# Patient Record
Sex: Female | Born: 1980 | Race: Black or African American | Hispanic: No | Smoking: Never smoker
Health system: Southern US, Community
[De-identification: ages and names within clinical notes are randomized; demographics above are authoritative.]

## PROBLEM LIST (undated history)

## (undated) DIAGNOSIS — D573 Sickle-cell trait: Secondary | ICD-10-CM

## (undated) DIAGNOSIS — R87629 Unspecified abnormal cytological findings in specimens from vagina: Secondary | ICD-10-CM

## (undated) DIAGNOSIS — Z789 Other specified health status: Secondary | ICD-10-CM

## (undated) HISTORY — PX: OTHER SURGICAL HISTORY: SHX169

## (undated) HISTORY — DX: Unspecified abnormal cytological findings in specimens from vagina: R87.629

## (undated) HISTORY — DX: Sickle-cell trait: D57.3

---

## 2002-06-04 DIAGNOSIS — R87629 Unspecified abnormal cytological findings in specimens from vagina: Secondary | ICD-10-CM

## 2002-06-04 HISTORY — DX: Unspecified abnormal cytological findings in specimens from vagina: R87.629

## 2013-01-15 LAB — OB RESULTS CONSOLE HGB/HCT, BLOOD
HCT: 32 %
HEMOGLOBIN: 9.1 g/dL

## 2013-01-15 LAB — OB RESULTS CONSOLE ABO/RH: RH Type: POSITIVE

## 2013-01-15 LAB — OB RESULTS CONSOLE HEPATITIS B SURFACE ANTIGEN: Hepatitis B Surface Ag: NEGATIVE

## 2013-01-15 LAB — OB RESULTS CONSOLE RUBELLA ANTIBODY, IGM: RUBELLA: IMMUNE

## 2013-01-15 LAB — OB RESULTS CONSOLE HIV ANTIBODY (ROUTINE TESTING): HIV: NONREACTIVE

## 2013-01-15 LAB — OB RESULTS CONSOLE PLATELET COUNT: Platelets: 282 10*3/uL

## 2013-01-15 LAB — OB RESULTS CONSOLE GC/CHLAMYDIA
Chlamydia: NEGATIVE
GC PROBE AMP, GENITAL: NEGATIVE

## 2013-01-15 LAB — OB RESULTS CONSOLE ANTIBODY SCREEN: Antibody Screen: NEGATIVE

## 2013-05-21 LAB — OB RESULTS CONSOLE PLATELET COUNT: Platelets: 288 10*3/uL

## 2013-05-21 LAB — OB RESULTS CONSOLE HGB/HCT, BLOOD
HCT: 29 %
HEMOGLOBIN: 9.1 g/dL

## 2013-05-21 LAB — GLUCOSE TOLERANCE, 1 HOUR: Glucose, 1 Hour GTT: 108

## 2013-06-24 ENCOUNTER — Encounter: Payer: Self-pay | Admitting: *Deleted

## 2013-07-02 DIAGNOSIS — B009 Herpesviral infection, unspecified: Secondary | ICD-10-CM | POA: Insufficient documentation

## 2013-07-02 DIAGNOSIS — O34219 Maternal care for unspecified type scar from previous cesarean delivery: Secondary | ICD-10-CM | POA: Insufficient documentation

## 2013-07-06 ENCOUNTER — Encounter: Payer: Self-pay | Admitting: Family Medicine

## 2013-07-07 ENCOUNTER — Telehealth: Payer: Self-pay | Admitting: *Deleted

## 2013-07-07 NOTE — Telephone Encounter (Signed)
[place] left a message requesting a call back. States she is 8 months pregnant and will be first appointment with [facility]. States she hasn't seen a physician since January and needs to be seen by a physician.  Edgardo RoysCalled Reily and she states since she left the message she has talked to someone and has an appointment 07/09/13 and doesn't have any other concerns.

## 2013-07-09 ENCOUNTER — Encounter: Payer: Self-pay | Admitting: Obstetrics & Gynecology

## 2013-07-09 ENCOUNTER — Encounter: Payer: Self-pay | Admitting: Obstetrics and Gynecology

## 2013-07-09 ENCOUNTER — Ambulatory Visit (INDEPENDENT_AMBULATORY_CARE_PROVIDER_SITE_OTHER): Payer: Self-pay | Admitting: Obstetrics & Gynecology

## 2013-07-09 VITALS — BP 110/68 | Temp 97.8°F | Ht 67.0 in | Wt 229.5 lb

## 2013-07-09 DIAGNOSIS — O34219 Maternal care for unspecified type scar from previous cesarean delivery: Secondary | ICD-10-CM

## 2013-07-09 DIAGNOSIS — B009 Herpesviral infection, unspecified: Secondary | ICD-10-CM

## 2013-07-09 LAB — POCT URINALYSIS DIP (DEVICE)
BILIRUBIN URINE: NEGATIVE
GLUCOSE, UA: NEGATIVE mg/dL
HGB URINE DIPSTICK: NEGATIVE
Ketones, ur: NEGATIVE mg/dL
Nitrite: NEGATIVE
Protein, ur: NEGATIVE mg/dL
SPECIFIC GRAVITY, URINE: 1.02 (ref 1.005–1.030)
Urobilinogen, UA: 1 mg/dL (ref 0.0–1.0)
pH: 6 (ref 5.0–8.0)

## 2013-07-09 NOTE — Progress Notes (Signed)
   Subjective:    Brenda Kelley is a [id number] 5155w6d being seen today for her first obstetrical visit.  Her obstetrical history is significant for obesity. Patient does intend to breast feed. Pregnancy history fully reviewed.  Patient reports no complaints.  Filed Vitals:   07/09/13 0848 07/09/13 0851  BP: 110/68   Temp: 97.8 F (36.6 C)   Height:  5\' 7"  (1.702 m)  Weight: 229 lb 8 oz (104.101 kg)     HISTORY: OB History  Gravida Para Term Preterm AB SAB TAB Ectopic Multiple Living  7 2 2  4  4   2     # Outcome Date GA Lbr Len/2nd Weight Sex Delivery Anes PTL Lv  7 CUR           6 TRM 02/13/08 233w0d   M LTCS Spinal N Y  5 TRM 09/13/04 533w0d  8 lb 1 oz (3.657 kg) F LTCS   Y     Comments: non reassuring fetal status  4 TAB           3 TAB           2 TAB           1 TAB              Past Medical History  Diagnosis Date  . Vaginal Pap smear, abnormal 2004   Past Surgical History  Procedure Laterality Date  . Cesarean section      x 2  . Keloid removal  2010   Family History  Problem Relation Age of Onset  . Heart disease Mother   . Hypertension Mother      Exam    Uterus:     Pelvic Exam:    Perineum: No Hemorrhoids   Vulva: normal   Vagina:  normal mucosa   pH:    Cervix: anteverted   Adnexa: normal adnexa   Bony Pelvis: android  System: Breast:  normal appearance, no masses or tenderness   Skin: normal coloration and turgor, no rashes    Neurologic: oriented   Extremities: normal strength, tone, and muscle mass   HEENT PERRLA   Mouth/Teeth mucous membranes moist, pharynx normal without lesions   Neck supple   Cardiovascular: regular rate and rhythm   Respiratory:  appears well, vitals normal, no respiratory distress, acyanotic, normal RR, ear and throat exam is normal, neck free of mass or lymphadenopathy, chest clear, no wheezing, crepitations, rhonchi, normal symmetric air entry   Abdomen: soft, non-tender; bowel sounds normal; no masses,  no  organomegaly   Urinary: urethral meatus normal      Assessment:    Pregnancy: [name] Patient Active Problem List   Diagnosis Date Noted  . 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 Previous cesarean delivery, delivered, with or without mention of antepartum condition 07/02/2013        Plan:     Initial labs drawn. Prenatal vitamins. Problem list reviewed and updated. Genetic Screening discussed Quad Screen: results reviewed.  Ultrasound discussed; fetal survey: results reviewed.  Follow up in 2 weeks. [facility] [place] Family is here. She wants a PPS with her RLTCS and is in the process of obtained medicaid. I will go ahead and send [place] an email to schedule her RLTCS and PPS for [redacted] weeks EGA.  Brenda Kelley C. 07/09/2013

## 2013-07-09 NOTE — Progress Notes (Deleted)
Nutrition note: 1st visit consult Pt has h/o obesity.

## 2013-07-09 NOTE — Progress Notes (Incomplete Revision)
Nutrition note: 1st visit consult Pt has h/o obesity. Pt has lost 10.5# @ 7822w6d. Pt stated her lowest was 224# about 2 months ago so she has gained 5.5# since then. Pt reports she had a lot of N/V in the beginning but is better now. Pt reports no heartburn. Pt reports eating 3 meals & 3-4 snacks/d. Pt is taking gummy PNV, which most do not include adequate minerals for pregnancy. Pt reports walking. Pt received verbal & written education on general nutrition during pregnancy. Encouraged pt to consume PNV or 2 chewable multivitamins. Encouraged energy dense snacks. Discussed wt gain goals of 11-20# or 0.5#/wk. Pt agrees to take adequate PNV. Pt has WIC & plans to BF/ pump. F/u in 2-4 wks Brenda RevealLaura Clif Serio, MS, RD, LDN, [facility]

## 2013-07-09 NOTE — Progress Notes (Signed)
Pulse- 93 Weight gain 25-35lb New ob packet given Flu vaccine given in [place]

## 2013-07-10 LAB — PRESCRIPTION MONITORING PROFILE (19 PANEL)
AMPHETAMINE/METH: NEGATIVE ng/mL
Barbiturate Screen, Urine: NEGATIVE ng/mL
Benzodiazepine Screen, Urine: NEGATIVE ng/mL
Buprenorphine, Urine: NEGATIVE ng/mL
CARISOPRODOL, URINE: NEGATIVE ng/mL
CREATININE, URINE: 178.1 mg/dL (ref >20.0)
Cannabinoid Scrn, Ur: NEGATIVE ng/mL
Cocaine Metabolites: NEGATIVE ng/mL
ECSTASY: NEGATIVE ng/mL
Fentanyl, Ur: NEGATIVE ng/mL
MEPERIDINE UR: NEGATIVE ng/mL
METHADONE SCREEN, URINE: NEGATIVE ng/mL
Methaqualone: NEGATIVE ng/mL
NITRITES URINE, INITIAL: NEGATIVE ug/mL
OXYCODONE SCRN UR: NEGATIVE ng/mL
Opiate Screen, Urine: NEGATIVE ng/mL
PH URINE, INITIAL: 6.3 pH (ref 4.5–8.9)
PROPOXYPHENE: NEGATIVE ng/mL
Phencyclidine, Ur: NEGATIVE ng/mL
TAPENTADOLUR: NEGATIVE ng/mL
TRAMADOL UR: NEGATIVE ng/mL
Zolpidem, Urine: NEGATIVE ng/mL

## 2013-07-12 LAB — CULTURE, OB URINE

## 2013-07-14 ENCOUNTER — Telehealth: Payer: Self-pay | Admitting: *Deleted

## 2013-07-14 DIAGNOSIS — N390 Urinary tract infection, site not specified: Secondary | ICD-10-CM

## 2013-07-14 NOTE — Telephone Encounter (Signed)
Message to patient to come and sign ROI before appt.  We need the hospital's name for the last cesarean section.  When received, scan and send to Dr. Penne LashLeggett.

## 2013-07-14 NOTE — Telephone Encounter (Signed)
Message copied by Dorothyann PengHAIZLIP, Mackena Plummer E on Tue Jul 14, 2013  9:07 AM ------      Message from: Lesly DukesLEGGETT, KELLY H      Created: Tue Jul 14, 2013  5:08 AM       Please request last c/s op note.  Scan and send to my attention, please.  Thx ------

## 2013-07-15 MED ORDER — SULFAMETHOXAZOLE-TMP DS 800-160 MG PO TABS: 1.0000 | ORAL_TABLET | Freq: Two times a day (BID) | ORAL | Status: DC

## 2013-07-15 NOTE — Telephone Encounter (Signed)
Called pt. And informed her of UTI and [facility].Also informed her we need her to come in to sign ROI so that we can request her C/S records from her last delivery. Pt. States she will come in at sometime before the end of the week to do this. Records need to be received, scanned and sent to Dr. Penne LashLeggett.

## 2013-07-15 NOTE — Telephone Encounter (Signed)
Message copied by Louanna RawAMPBELL, Brienna Bass M on Wed Jul 15, 2013  8:41 AM ------      Message from: Allie BossierVE, MYRA C      Created: Tue Jul 14, 2013  1:37 PM       She has a UTI. Can you please prescribe bactrim DS po BID for 5 days?       Thanks ------

## 2013-07-22 ENCOUNTER — Ambulatory Visit (INDEPENDENT_AMBULATORY_CARE_PROVIDER_SITE_OTHER): Payer: Medicaid - Out of State | Admitting: Family

## 2013-07-22 ENCOUNTER — Encounter: Payer: Self-pay | Admitting: Family

## 2013-07-22 VITALS — BP 130/78 | Temp 96.5°F | Wt 230.0 lb

## 2013-07-22 DIAGNOSIS — O34219 Maternal care for unspecified type scar from previous cesarean delivery: Secondary | ICD-10-CM

## 2013-07-22 DIAGNOSIS — Z3480 Encounter for supervision of other normal pregnancy, unspecified trimester: Secondary | ICD-10-CM

## 2013-07-22 DIAGNOSIS — Z3490 Encounter for supervision of normal pregnancy, unspecified, unspecified trimester: Secondary | ICD-10-CM | POA: Insufficient documentation

## 2013-07-22 DIAGNOSIS — B009 Herpesviral infection, unspecified: Secondary | ICD-10-CM

## 2013-07-22 LAB — POCT URINALYSIS DIP (DEVICE)
BILIRUBIN URINE: NEGATIVE
GLUCOSE, UA: NEGATIVE mg/dL
HGB URINE DIPSTICK: NEGATIVE
Ketones, ur: NEGATIVE mg/dL
Leukocytes, UA: NEGATIVE
NITRITE: NEGATIVE
Protein, ur: NEGATIVE mg/dL
SPECIFIC GRAVITY, URINE: 1.02 (ref 1.005–1.030)
Urobilinogen, UA: 1 mg/dL (ref 0.0–1.0)
pH: 6.5 (ref 5.0–8.0)

## 2013-07-22 NOTE — Progress Notes (Signed)
No questions or concerns.  Pt transfer from [place], reviewed prenatal records all labs nml.  Desires BTL; [place] > need consent signed once Medicaid approved here.  Begin acyclovir at 36 wks.

## 2013-07-22 NOTE — Progress Notes (Signed)
Pulse- 91 Patient reports contractions every couple days or so

## 2013-07-23 ENCOUNTER — Ambulatory Visit (HOSPITAL_COMMUNITY)
Admission: RE | Admit: 2013-07-23 | Discharge: 2013-07-23 | Disposition: A | Discharge status: Home / Self Care | Payer: Medicaid Other | Source: Ambulatory Visit | Attending: Family | Admitting: Family

## 2013-07-23 ENCOUNTER — Other Ambulatory Visit: Payer: Self-pay | Admitting: Family

## 2013-07-23 DIAGNOSIS — O34219 Maternal care for unspecified type scar from previous cesarean delivery: Secondary | ICD-10-CM

## 2013-07-23 DIAGNOSIS — Z3689 Encounter for other specified antenatal screening: Secondary | ICD-10-CM | POA: Diagnosis not present

## 2013-07-27 ENCOUNTER — Encounter: Payer: Self-pay | Admitting: Obstetrics & Gynecology

## 2013-08-04 ENCOUNTER — Ambulatory Visit (INDEPENDENT_AMBULATORY_CARE_PROVIDER_SITE_OTHER): Payer: Medicaid Other | Admitting: Obstetrics & Gynecology

## 2013-08-04 VITALS — BP 109/75 | Temp 97.0°F | Wt 230.4 lb

## 2013-08-04 DIAGNOSIS — Z3490 Encounter for supervision of normal pregnancy, unspecified, unspecified trimester: Secondary | ICD-10-CM

## 2013-08-04 DIAGNOSIS — O34219 Maternal care for unspecified type scar from previous cesarean delivery: Secondary | ICD-10-CM

## 2013-08-04 DIAGNOSIS — B009 Herpesviral infection, unspecified: Secondary | ICD-10-CM

## 2013-08-04 LAB — POCT URINALYSIS DIP (DEVICE)
BILIRUBIN URINE: NEGATIVE
Glucose, UA: NEGATIVE mg/dL
KETONES UR: NEGATIVE mg/dL
Nitrite: NEGATIVE
PH: 6 (ref 5.0–8.0)
Protein, ur: 30 mg/dL — AB
Specific Gravity, Urine: 1.02 (ref 1.005–1.030)
Urobilinogen, UA: 1 mg/dL (ref 0.0–1.0)

## 2013-08-04 MED ORDER — VALACYCLOVIR HCL 500 MG PO TABS: 500.0000 mg | ORAL_TABLET | Freq: Two times a day (BID) | ORAL | Status: DC

## 2013-08-04 NOTE — Progress Notes (Signed)
p 

## 2013-08-04 NOTE — Patient Instructions (Signed)
Cesarean Delivery °Care After °Refer to this sheet in the next few weeks. These instructions provide you with information on caring for yourself after your procedure. Your health care provider may also give you specific instructions. Your treatment has been planned according to current medical practices, but problems sometimes occur. Call your health care provider if you have any problems or questions after you go home. °HOME CARE INSTRUCTIONS  °· Only take over-the-counter or prescription medications as directed by your health care provider. °· Do not drink alcohol, especially if you are breastfeeding or taking medication to relieve pain. °· Do not chew or smoke tobacco. °· Continue to use good perineal care. Good perineal care includes: °· Wiping your perineum from front to back. °· Keeping your perineum clean. °· Check your surgical cut (incision) daily for increased redness, drainage, swelling, or separation of skin. °· Clean your incision gently with soap and water every day, and then pat it dry. If your health care provider says it is OK, leave the incision uncovered. Use a bandage (dressing) if the incision is draining fluid or appears irritated. If the adhesive strips across the incision do not fall off within 7 days, carefully peel them off. °· Hug a pillow when coughing or sneezing until your incision is healed. This helps to relieve pain. °· Do not use tampons or douche until your health care provider says it is okay. °· Shower, wash your hair, and take tub baths as directed by your health care provider. °· Wear a well-fitting bra that provides breast support. °· Limit wearing support panties or control-top hose. °· Drink enough fluids to keep your urine clear or pale yellow. °· Eat high-fiber foods such as whole grain cereals and breads, brown rice, beans, and fresh fruits and vegetables every day. These foods may help prevent or relieve constipation. °· Resume activities such as climbing stairs,  driving, lifting, exercising, or traveling as directed by your health care provider. °· Talk to your health care provider about resuming sexual activities. This is dependent upon your risk of infection, your rate of healing, and your comfort and desire to resume sexual activity. °· Try to have someone help you with your household activities and your newborn for at least a few days after you leave the hospital. °· Rest as much as possible. Try to rest or take a nap when your newborn is sleeping. °· Increase your activities gradually. °· Keep all of your scheduled postpartum appointments. It is very important to keep your scheduled follow-up appointments. At these appointments, your health care provider will be checking to make sure that you are healing physically and emotionally. °SEEK MEDICAL CARE IF:  °· You are passing large clots from your vagina. Save any clots to show your health care provider. °· You have a foul smelling discharge from your vagina. °· You have trouble urinating. °· You are urinating frequently. °· You have pain when you urinate. °· You have a change in your bowel movements. °· You have increasing redness, pain, or swelling near your incision. °· You have pus draining from your incision. °· Your incision is separating. °· You have painful, hard, or reddened breasts. °· You have a severe headache. °· You have blurred vision or see spots. °· You feel sad or depressed. °· You have thoughts of hurting yourself or your newborn. °· You have questions about your care, the care of your newborn, or medications. °· You are dizzy or lightheaded. °· You have a rash. °· You   have pain, redness, or swelling at the site of the removed intravenous access (IV) tube. °· You have nausea or vomiting. °· You stopped breastfeeding and have not had a menstrual period within 12 weeks of stopping. °· You are not breastfeeding and have not had a menstrual period within 12 weeks of delivery. °· You have a fever. °SEEK  IMMEDIATE MEDICAL CARE IF: °· You have persistent pain. °· You have chest pain. °· You have shortness of breath. °· You faint. °· You have leg pain. °· You have stomach pain. °· Your vaginal bleeding saturates 2 or more sanitary pads in 1 hour. °MAKE SURE YOU:  °· Understand these instructions. °· Will watch your condition. °· Will get help right away if you are not doing well or get worse. °Document Released: 02/10/2002 Document Revised: 01/21/2013 Document Reviewed: 01/16/2012 °ExitCare® Patient Information ©2014 Exit[employer], LLC. ° ° ° °

## 2013-08-04 NOTE — Progress Notes (Signed)
CS scheduled3/20/15.

## 2013-08-04 NOTE — Progress Notes (Signed)
Start Valtrex for suppression. GBS and GC CT today. US 2/19 reviewed. CS 3/20

## 2013-08-05 LAB — GC/CHLAMYDIA PROBE AMP
CT PROBE, AMP APTIMA: POSITIVE — AB
GC Probe RNA: NEGATIVE

## 2013-08-05 LAB — OB RESULTS CONSOLE GC/CHLAMYDIA
Chlamydia: POSITIVE
Gonorrhea: NEGATIVE

## 2013-08-05 LAB — OB RESULTS CONSOLE GBS: GBS: NEGATIVE

## 2013-08-06 ENCOUNTER — Telehealth: Payer: Self-pay

## 2013-08-06 DIAGNOSIS — A749 Chlamydial infection, unspecified: Secondary | ICD-10-CM

## 2013-08-06 LAB — CULTURE, BETA STREP (GROUP B ONLY)

## 2013-08-06 MED ORDER — AZITHROMYCIN 500 MG PO TABS: 1000.0000 mg | ORAL_TABLET | Freq: Once | ORAL | Status: DC

## 2013-08-06 NOTE — Telephone Encounter (Signed)
Message copied by Louanna RawAMPBELL, Maudene Stotler M on Thu Aug 06, 2013  8:26 AM ------      Message from: Adam PhenixARNOLD, JAMES G      Created: Wed Aug 05, 2013  1:50 PM       Needs azithromycin 1 g PO for chlamydia ------

## 2013-08-06 NOTE — Telephone Encounter (Signed)
Called pt. And informed her of positive chlamydia culture and the need to take Zithromax to treat it- [facility]. Informed her to ensure her partner gets treated and they abstain from sex until both are treated. Pt. Verbalized understanding and had no questions or concerns.

## 2013-08-06 NOTE — Progress Notes (Signed)
ROI (to request C/s notes from 2008/9) faxed to [facility] [phone] on 08/03/13 by Marylynn Pearsonarrie Hillman, RN. No reports faxed back. Called today and spoke to Medical Records to follow up. Staff member stated he did not see ROI-- re-faxed ROI to [phone] with ATTN: Medical Records and please send C/S notes ASAP per request of Medical Records Staff member. Called an hour later to ensure it was received-- staff member stated it was received and they are working on it now and that we should have it by end of day.

## 2013-08-08 ENCOUNTER — Encounter: Payer: Self-pay | Admitting: *Deleted

## 2013-08-10 ENCOUNTER — Encounter: Payer: Self-pay | Admitting: *Deleted

## 2013-08-11 ENCOUNTER — Ambulatory Visit (INDEPENDENT_AMBULATORY_CARE_PROVIDER_SITE_OTHER): Payer: Medicaid Other | Admitting: Obstetrics & Gynecology

## 2013-08-11 ENCOUNTER — Encounter: Payer: Self-pay | Admitting: Obstetrics & Gynecology

## 2013-08-11 VITALS — BP 111/71 | Temp 98.1°F | Wt 232.6 lb

## 2013-08-11 DIAGNOSIS — O3483 Maternal care for other abnormalities of pelvic organs, third trimester: Secondary | ICD-10-CM | POA: Diagnosis present

## 2013-08-11 DIAGNOSIS — D649 Anemia, unspecified: Secondary | ICD-10-CM

## 2013-08-11 DIAGNOSIS — N736 Female pelvic peritoneal adhesions (postinfective): Secondary | ICD-10-CM | POA: Diagnosis present

## 2013-08-11 DIAGNOSIS — B009 Herpesviral infection, unspecified: Secondary | ICD-10-CM

## 2013-08-11 DIAGNOSIS — Z113 Encounter for screening for infections with a predominantly sexual mode of transmission: Secondary | ICD-10-CM

## 2013-08-11 DIAGNOSIS — Z3480 Encounter for supervision of other normal pregnancy, unspecified trimester: Secondary | ICD-10-CM

## 2013-08-11 DIAGNOSIS — Z23 Encounter for immunization: Secondary | ICD-10-CM

## 2013-08-11 DIAGNOSIS — Z3490 Encounter for supervision of normal pregnancy, unspecified, unspecified trimester: Secondary | ICD-10-CM

## 2013-08-11 LAB — CBC
HCT: 30.7 % — ABNORMAL LOW (ref 36.0–46.0)
Hemoglobin: 10 g/dL — ABNORMAL LOW (ref 12.0–15.0)
MCH: 20.8 pg — ABNORMAL LOW (ref 26.0–34.0)
MCHC: 32.6 g/dL (ref 30.0–36.0)
MCV: 63.8 fL — ABNORMAL LOW (ref 78.0–100.0)
Platelets: 249 10*3/uL (ref 150–400)
RBC: 4.81 MIL/uL (ref 3.87–5.11)
RDW: 19.2 % — ABNORMAL HIGH (ref 11.5–15.5)
WBC: 4.9 10*3/uL (ref 4.0–10.5)

## 2013-08-11 LAB — POCT URINALYSIS DIP (DEVICE)
Bilirubin Urine: NEGATIVE
Glucose, UA: NEGATIVE mg/dL
Hgb urine dipstick: NEGATIVE
Ketones, ur: NEGATIVE mg/dL
Nitrite: NEGATIVE
Protein, ur: NEGATIVE mg/dL
Specific Gravity, Urine: 1.02 (ref 1.005–1.030)
Urobilinogen, UA: 1 mg/dL (ref 0.0–1.0)
pH: 6.5 (ref 5.0–8.0)

## 2013-08-11 LAB — RPR

## 2013-08-11 LAB — HIV ANTIBODY (ROUTINE TESTING W REFLEX): HIV: NONREACTIVE

## 2013-08-11 MED ORDER — TETANUS-DIPHTH-ACELL PERTUSSIS 5-2.5-18.5 LF-MCG/0.5 IM SUSP: 0.5000 mL | Freq: Once | INTRAMUSCULAR | Status: DC

## 2013-08-11 NOTE — Progress Notes (Signed)
P= 91 C/o of braxton hicks Pt. Requests to have HIV testing and iron labs drawn because she was told she was anemic before she transferred and would like to know if her levels are where she needs to be.

## 2013-08-11 NOTE — Patient Instructions (Signed)
HIV Antibody Test HIV is a virus which destroys our body's ability to fight illness. It does this by causing defects in our immune system. This is the system that protects our body against infections. This virus is the cause of an illness called AIDS. HIV antibodies are made by the infected person's body when a person becomes infected with HIV. WHAT IS THE HIV ANTIBODY TEST? This is a test for HIV antibodies that are found in the blood of an infected person. This test is not a test for AIDS. It only means that you have been infected with HIV and may eventually develop AIDS. WHO IS AT RISK OF BEING INFECTED WITH HIV?  People who have unsafe sex (unsafe sex means having sex without a condom (or other protective barrier) with a person who has the virus.  People who share IV needles or syringes with a person who has the virus.  Anyone who got blood, blood products, or organ transplants before 1985.  Babies born to mothers who have HIV.  Coming in contact with blood or other body fluids of someone infected with HIV. WHAT DOES A NEGATIVE HIV TEST RESULT MEAN? HIV antibodies were not found in your blood. Most of the time it takes our bodies between 6 weeks and 6 months to develop antibodies to HIV. It may take up to one year to develop. During this time, infected people can have a negative result even if they have the virus and will therefore not know if they are putting other people at risk. They should take all necessary precautions to protect others from becoming infected. WHAT DOES A POSITIVE HIV TEST RESULT MEAN?  A positive HIV test means a person has been infected with the HIV virus. This does NOT mean that a person has AIDS, but they may eventually develop it.  A person can give HIV infection to other people through unsafe sex. Sharing IV needles or syringes can also spread HIV.  A woman who has HIV can give the virus to her baby during pregnancy or at birth or possibly from breastfeeding.  Get counseling prior to considering pregnancy. One third to one half of women with HIV infection will pass this infection on to their baby.  Anyone with a positive test for HIV should not donate blood, plasma, blood products, organs or tissues. WHERE CAN I GO TO BE TESTED?  Most county health departments offer HIV Antibody counseling and testing.  Many doctors and other caregivers offer HIV Antibody counseling and testing. If you received a blood test from your caregiver, call for your results as instructed. Remember it is your responsibility to get the results of your test. Do not assume everything is fine if you do not hear from your caregiver. If you get a positive test result, talk to your caregiver to find out what steps to take to assure you receive the best of care. Numerous medications are now available which improve the course of this infection. Document Released: 05/18/2000 Document Revised: 08/13/2011 Document Reviewed: 05/21/2005 ExitCare Patient Information 2014 ExitCare, LLC.     

## 2013-08-11 NOTE — Progress Notes (Signed)
Pt c/o occ ctx Wants HIV test done as spouse 'gave me chlamydia' No LOF

## 2013-08-13 ENCOUNTER — Encounter (HOSPITAL_COMMUNITY): Payer: Self-pay | Admitting: Pharmacist

## 2013-08-18 ENCOUNTER — Ambulatory Visit (INDEPENDENT_AMBULATORY_CARE_PROVIDER_SITE_OTHER): Payer: Medicaid Other | Admitting: Medical

## 2013-08-18 VITALS — BP 120/78 | Temp 98.5°F | Wt 232.3 lb

## 2013-08-18 DIAGNOSIS — Z3480 Encounter for supervision of other normal pregnancy, unspecified trimester: Secondary | ICD-10-CM

## 2013-08-18 DIAGNOSIS — Z3490 Encounter for supervision of normal pregnancy, unspecified, unspecified trimester: Secondary | ICD-10-CM

## 2013-08-18 LAB — POCT URINALYSIS DIP (DEVICE)
BILIRUBIN URINE: NEGATIVE
Glucose, UA: NEGATIVE mg/dL
Ketones, ur: NEGATIVE mg/dL
Nitrite: NEGATIVE
Protein, ur: NEGATIVE mg/dL
Specific Gravity, Urine: 1.015 (ref 1.005–1.030)
Urobilinogen, UA: 1 mg/dL (ref 0.0–1.0)
pH: 6.5 (ref 5.0–8.0)

## 2013-08-18 NOTE — Progress Notes (Signed)
Patient doing well. +FM. Braxton Hicks Ctx. No LOF.

## 2013-08-18 NOTE — Progress Notes (Signed)
p=106 

## 2013-08-18 NOTE — Patient Instructions (Signed)
Levonorgestrel intrauterine device (IUD) What is this medicine? LEVONORGESTREL IUD (LEE voe nor jes trel) is a contraceptive (birth control) device. The device is placed inside the uterus by a healthcare professional. It is used to prevent pregnancy and can also be used to treat heavy bleeding that occurs during your period. Depending on the device, it can be used for 3 to 5 years. This medicine may be used for other purposes; ask your health care provider or pharmacist if you have questions. COMMON BRAND NAME(S): Mirena, Skyla What should I tell my health care provider before I take this medicine? They need to know if you have any of these conditions: -abnormal Pap smear -cancer of the breast, uterus, or cervix -diabetes -endometritis -genital or pelvic infection now or in the past -have more than one sexual partner or your partner has more than one partner -heart disease -history of an ectopic or tubal pregnancy -immune system problems -IUD in place -liver disease or tumor -problems with blood clots or take blood-thinners -use intravenous drugs -uterus of unusual shape -vaginal bleeding that has not been explained -an unusual or allergic reaction to levonorgestrel, other hormones, silicone, or polyethylene, medicines, foods, dyes, or preservatives -pregnant or trying to get pregnant -breast-feeding How should I use this medicine? This device is placed inside the uterus by a health care professional. Talk to your pediatrician regarding the use of this medicine in children. Special care may be needed. Overdosage: If you think you have taken too much of this medicine contact a poison control center or emergency room at once. NOTE: This medicine is only for you. Do not share this medicine with others. What if I miss a dose? This does not apply. What may interact with this medicine? Do not take this medicine with any of the following  medications: -amprenavir -bosentan -fosamprenavir This medicine may also interact with the following medications: -aprepitant -barbiturate medicines for inducing sleep or treating seizures -bexarotene -griseofulvin -medicines to treat seizures like carbamazepine, ethotoin, felbamate, oxcarbazepine, phenytoin, topiramate -modafinil -pioglitazone -rifabutin -rifampin -rifapentine -some medicines to treat HIV infection like atazanavir, indinavir, lopinavir, nelfinavir, tipranavir, ritonavir -St. John's wort -warfarin This list may not describe all possible interactions. Give your health care provider a list of all the medicines, herbs, non-prescription drugs, or dietary supplements you use. Also tell them if you smoke, drink alcohol, or use illegal drugs. Some items may interact with your medicine. What should I watch for while using this medicine? Visit your doctor or health care professional for regular check ups. See your doctor if you or your partner has sexual contact with others, becomes HIV positive, or gets a sexual transmitted disease. This product does not protect you against HIV infection (AIDS) or other sexually transmitted diseases. You can check the placement of the IUD yourself by reaching up to the top of your vagina with clean fingers to feel the threads. Do not pull on the threads. It is a good habit to check placement after each menstrual period. Call your doctor right away if you feel more of the IUD than just the threads or if you cannot feel the threads at all. The IUD may come out by itself. You may become pregnant if the device comes out. If you notice that the IUD has come out use a backup birth control method like condoms and call your health care provider. Using tampons will not change the position of the IUD and are okay to use during your period. What side effects may I   notice from receiving this medicine? Side effects that you should report to your doctor or  health care professional as soon as possible: -allergic reactions like skin rash, itching or hives, swelling of the face, lips, or tongue -fever, flu-like symptoms -genital sores -high blood pressure -no menstrual period for 6 weeks during use -pain, swelling, warmth in the leg -pelvic pain or tenderness -severe or sudden headache -signs of pregnancy -stomach cramping -sudden shortness of breath -trouble with balance, talking, or walking -unusual vaginal bleeding, discharge -yellowing of the eyes or skin Side effects that usually do not require medical attention (report to your doctor or health care professional if they continue or are bothersome): -acne -breast pain -change in sex drive or performance -changes in weight -cramping, dizziness, or faintness while the device is being inserted -headache -irregular menstrual bleeding within first 3 to 6 months of use -nausea This list may not describe all possible side effects. Call your doctor for medical advice about side effects. You may report side effects to FDA at 1-800-FDA-1088. Where should I keep my medicine? This does not apply. NOTE: This sheet is a summary. It may not cover all possible information. If you have questions about this medicine, talk to your doctor, pharmacist, or health care provider.  2014, Elsevier/Gold Standard. (2011-06-21 13:54:04)  

## 2013-08-19 ENCOUNTER — Encounter: Payer: Self-pay | Admitting: *Deleted

## 2013-08-20 ENCOUNTER — Encounter (HOSPITAL_COMMUNITY): Payer: Self-pay

## 2013-08-20 ENCOUNTER — Encounter (HOSPITAL_COMMUNITY)
Admission: RE | Admit: 2013-08-20 | Discharge: 2013-08-20 | Disposition: A | Discharge status: Home / Self Care | Payer: Medicaid Other | Source: Ambulatory Visit | Attending: Obstetrics & Gynecology | Admitting: Obstetrics & Gynecology

## 2013-08-20 HISTORY — DX: Other specified health status: Z78.9

## 2013-08-20 LAB — CBC
HCT: 29.3 % — ABNORMAL LOW (ref 36.0–46.0)
Hemoglobin: 9.9 g/dL — ABNORMAL LOW (ref 12.0–15.0)
MCH: 20.7 pg — ABNORMAL LOW (ref 26.0–34.0)
MCHC: 33.8 g/dL (ref 30.0–36.0)
MCV: 61.2 fL — AB (ref 78.0–100.0)
Platelets: 258 10*3/uL (ref 150–400)
RBC: 4.79 MIL/uL (ref 3.87–5.11)
RDW: 20 % — ABNORMAL HIGH (ref 11.5–15.5)
WBC: 5.3 10*3/uL (ref 4.0–10.5)

## 2013-08-20 LAB — TYPE AND SCREEN
ABO/RH(D): O POS
Antibody Screen: NEGATIVE

## 2013-08-20 LAB — RPR: RPR: NONREACTIVE

## 2013-08-20 LAB — ABO/RH: ABO/RH(D): O POS

## 2013-08-20 NOTE — Patient Instructions (Signed)
20 Brenda Kelley  08/20/2013   Your procedure is scheduled on:  08/21/13  Enter through the Main [facility]trance [facility] at 8 AM.  Pick up the phone at the desk and dial [phone].   Call this number if you have problems the morning of surgery: [phone]   Remember:   Do not eat food:After Midnight.  Do not drink clear liquids: After Midnight.  Take these medicines the morning of surgery with A SIP OF WATER: NA   Do not wear jewelry, make-up or nail polish.  Do not wear lotions, powders, or perfumes. You may wear deodorant.  Do not shave 24 hours prior to surgery.  Do not bring valuables to the hospital.  [facility] is not   responsible for any belongings or valuables brought to the hospital.  Contacts, dentures or bridgework may not be worn into surgery.  Leave suitcase in the car. After surgery it may be brought to your room.  For patients admitted to the hospital, checkout time is 11:00 AM the day of              discharge.   Patients discharged the day of surgery will not be allowed to drive             home.  Name and phone number of your driver: NA  Special Instructions:      Please read over the following fact sheets that you were given:   Surgical Site Infection Prevention

## 2013-08-21 ENCOUNTER — Inpatient Hospital Stay (HOSPITAL_COMMUNITY): Payer: Medicaid Other | Admitting: Certified Registered"

## 2013-08-21 ENCOUNTER — Encounter (HOSPITAL_COMMUNITY)
Admission: RE | Disposition: A | Discharge status: Home / Self Care | Payer: Self-pay | Source: Ambulatory Visit | Attending: Obstetrics & Gynecology

## 2013-08-21 ENCOUNTER — Encounter (HOSPITAL_COMMUNITY): Payer: Self-pay | Admitting: *Deleted

## 2013-08-21 ENCOUNTER — Encounter (HOSPITAL_COMMUNITY): Payer: Medicaid Other | Admitting: Certified Registered"

## 2013-08-21 ENCOUNTER — Inpatient Hospital Stay (HOSPITAL_COMMUNITY)
Admission: RE | Admit: 2013-08-21 | Discharge: 2013-08-23 | DRG: 766 | Disposition: A | Discharge status: Home / Self Care | Payer: Medicaid Other | Source: Ambulatory Visit | Attending: Obstetrics & Gynecology | Admitting: Obstetrics & Gynecology

## 2013-08-21 DIAGNOSIS — O3483 Maternal care for other abnormalities of pelvic organs, third trimester: Secondary | ICD-10-CM | POA: Diagnosis present

## 2013-08-21 DIAGNOSIS — N736 Female pelvic peritoneal adhesions (postinfective): Secondary | ICD-10-CM | POA: Diagnosis present

## 2013-08-21 DIAGNOSIS — O34219 Maternal care for unspecified type scar from previous cesarean delivery: Principal | ICD-10-CM | POA: Diagnosis present

## 2013-08-21 DIAGNOSIS — Z98891 History of uterine scar from previous surgery: Secondary | ICD-10-CM

## 2013-08-21 DIAGNOSIS — Z3490 Encounter for supervision of normal pregnancy, unspecified, unspecified trimester: Secondary | ICD-10-CM

## 2013-08-21 SURGERY — Surgical Case
Anesthesia: Spinal | Site: Abdomen | Laterality: Bilateral

## 2013-08-21 MED ORDER — MENTHOL 3 MG MT LOZG: 1.0000 | LOZENGE | OROMUCOSAL | Status: DC | PRN

## 2013-08-21 MED ORDER — PHENYLEPHRINE 8 MG IN D5W 100 ML (0.08MG/ML) PREMIX OPTIME
INJECTION | INTRAVENOUS | Status: AC
  Filled 2013-08-21: qty 100

## 2013-08-21 MED ORDER — SIMETHICONE 80 MG PO CHEW: 80.0000 mg | CHEWABLE_TABLET | ORAL | Status: DC | PRN

## 2013-08-21 MED ORDER — ONDANSETRON HCL 4 MG PO TABS: 4.0000 mg | ORAL_TABLET | ORAL | Status: DC | PRN

## 2013-08-21 MED ORDER — TETANUS-DIPHTH-ACELL PERTUSSIS 5-2.5-18.5 LF-MCG/0.5 IM SUSP: 0.5000 mL | Freq: Once | INTRAMUSCULAR | Status: DC

## 2013-08-21 MED ORDER — PHENYLEPHRINE HCL 10 MG/ML IJ SOLN
INTRAMUSCULAR | Status: DC | PRN
  Administered 2013-08-21: 120 ug via INTRAVENOUS

## 2013-08-21 MED ORDER — IBUPROFEN 600 MG PO TABS
600.0000 mg | ORAL_TABLET | Freq: Four times a day (QID) | ORAL | Status: DC | PRN
  Administered 2013-08-22: 600 mg via ORAL

## 2013-08-21 MED ORDER — LACTATED RINGERS IV SOLN
INTRAVENOUS | Status: DC
  Administered 2013-08-21 (×3): via INTRAVENOUS

## 2013-08-21 MED ORDER — SIMETHICONE 80 MG PO CHEW
80.0000 mg | CHEWABLE_TABLET | Freq: Three times a day (TID) | ORAL | Status: DC
  Administered 2013-08-21 – 2013-08-23 (×6): 80 mg via ORAL
  Filled 2013-08-21 (×6): qty 1

## 2013-08-21 MED ORDER — SCOPOLAMINE 1 MG/3DAYS TD PT72
1.0000 | MEDICATED_PATCH | Freq: Once | TRANSDERMAL | Status: DC
  Filled 2013-08-21: qty 1

## 2013-08-21 MED ORDER — KETOROLAC TROMETHAMINE 30 MG/ML IJ SOLN: 30.0000 mg | Freq: Four times a day (QID) | INTRAMUSCULAR | Status: AC | PRN

## 2013-08-21 MED ORDER — WITCH HAZEL-GLYCERIN EX PADS: 1.0000 "application " | MEDICATED_PAD | CUTANEOUS | Status: DC | PRN

## 2013-08-21 MED ORDER — MORPHINE SULFATE 0.5 MG/ML IJ SOLN
INTRAMUSCULAR | Status: AC
  Filled 2013-08-21: qty 10

## 2013-08-21 MED ORDER — MAGNESIUM HYDROXIDE 400 MG/5ML PO SUSP: 30.0000 mL | ORAL | Status: DC | PRN

## 2013-08-21 MED ORDER — CEFAZOLIN SODIUM-DEXTROSE 2-3 GM-% IV SOLR: 2.0000 g | INTRAVENOUS | Status: DC

## 2013-08-21 MED ORDER — NALBUPHINE HCL 10 MG/ML IJ SOLN: 5.0000 mg | INTRAMUSCULAR | Status: DC | PRN

## 2013-08-21 MED ORDER — IBUPROFEN 600 MG PO TABS
600.0000 mg | ORAL_TABLET | Freq: Four times a day (QID) | ORAL | Status: DC
  Administered 2013-08-21 – 2013-08-23 (×7): 600 mg via ORAL
  Filled 2013-08-21 (×8): qty 1

## 2013-08-21 MED ORDER — ONDANSETRON HCL 4 MG/2ML IJ SOLN: 4.0000 mg | Freq: Three times a day (TID) | INTRAMUSCULAR | Status: DC | PRN

## 2013-08-21 MED ORDER — MEPERIDINE HCL 25 MG/ML IJ SOLN: 6.2500 mg | INTRAMUSCULAR | Status: DC | PRN

## 2013-08-21 MED ORDER — LACTATED RINGERS IV SOLN
Freq: Once | INTRAVENOUS | Status: AC
  Administered 2013-08-21: 08:00:00 via INTRAVENOUS

## 2013-08-21 MED ORDER — DIPHENHYDRAMINE HCL 25 MG PO CAPS: 25.0000 mg | ORAL_CAPSULE | ORAL | Status: DC | PRN

## 2013-08-21 MED ORDER — PHENYLEPHRINE 40 MCG/ML (10ML) SYRINGE FOR IV PUSH (FOR BLOOD PRESSURE SUPPORT)
PREFILLED_SYRINGE | INTRAVENOUS | Status: AC
  Filled 2013-08-21: qty 5

## 2013-08-21 MED ORDER — GLYCOPYRROLATE 0.2 MG/ML IJ SOLN
INTRAMUSCULAR | Status: DC | PRN
  Administered 2013-08-21: 0.2 mg via INTRAVENOUS
  Administered 2013-08-21: 0.1 mg via INTRAVENOUS

## 2013-08-21 MED ORDER — FENTANYL CITRATE 0.05 MG/ML IJ SOLN
INTRAMUSCULAR | Status: AC
  Filled 2013-08-21: qty 2

## 2013-08-21 MED ORDER — DIPHENHYDRAMINE HCL 25 MG PO CAPS: 25.0000 mg | ORAL_CAPSULE | Freq: Four times a day (QID) | ORAL | Status: DC | PRN

## 2013-08-21 MED ORDER — OXYTOCIN 10 UNIT/ML IJ SOLN
40.0000 [IU] | INTRAVENOUS | Status: DC | PRN
  Administered 2013-08-21: 40 [IU] via INTRAVENOUS

## 2013-08-21 MED ORDER — DIBUCAINE 1 % RE OINT: 1.0000 "application " | TOPICAL_OINTMENT | RECTAL | Status: DC | PRN

## 2013-08-21 MED ORDER — KETOROLAC TROMETHAMINE 30 MG/ML IJ SOLN
15.0000 mg | Freq: Once | INTRAMUSCULAR | Status: AC | PRN
  Administered 2013-08-21: 30 mg via INTRAVENOUS

## 2013-08-21 MED ORDER — FENTANYL CITRATE 0.05 MG/ML IJ SOLN
INTRAMUSCULAR | Status: DC | PRN
  Administered 2013-08-21: 25 ug via INTRATHECAL

## 2013-08-21 MED ORDER — ONDANSETRON HCL 4 MG/2ML IJ SOLN
INTRAMUSCULAR | Status: DC | PRN
  Administered 2013-08-21: 4 mg via INTRAVENOUS

## 2013-08-21 MED ORDER — KETOROLAC TROMETHAMINE 30 MG/ML IJ SOLN
INTRAMUSCULAR | Status: AC
  Filled 2013-08-21: qty 1

## 2013-08-21 MED ORDER — CEFAZOLIN SODIUM-DEXTROSE 2-3 GM-% IV SOLR
INTRAVENOUS | Status: AC
  Filled 2013-08-21: qty 50

## 2013-08-21 MED ORDER — ONDANSETRON HCL 4 MG/2ML IJ SOLN: 4.0000 mg | INTRAMUSCULAR | Status: DC | PRN

## 2013-08-21 MED ORDER — OXYTOCIN 10 UNIT/ML IJ SOLN
INTRAMUSCULAR | Status: AC
  Filled 2013-08-21: qty 4

## 2013-08-21 MED ORDER — SCOPOLAMINE 1 MG/3DAYS TD PT72
MEDICATED_PATCH | TRANSDERMAL | Status: AC
  Administered 2013-08-21: 1.5 mg via TRANSDERMAL
  Filled 2013-08-21: qty 1

## 2013-08-21 MED ORDER — PHENYLEPHRINE 8 MG IN D5W 100 ML (0.08MG/ML) PREMIX OPTIME
INJECTION | INTRAVENOUS | Status: DC | PRN
  Administered 2013-08-21: 50 ug/min via INTRAVENOUS

## 2013-08-21 MED ORDER — SODIUM CHLORIDE 0.9 % IJ SOLN: 3.0000 mL | INTRAMUSCULAR | Status: DC | PRN

## 2013-08-21 MED ORDER — LACTATED RINGERS IV SOLN
INTRAVENOUS | Status: DC
  Administered 2013-08-21: 10:00:00 via INTRAVENOUS

## 2013-08-21 MED ORDER — ONDANSETRON HCL 4 MG/2ML IJ SOLN
INTRAMUSCULAR | Status: AC
  Administered 2013-08-21: 4 mg via INTRAVENOUS
  Filled 2013-08-21: qty 2

## 2013-08-21 MED ORDER — HYDROMORPHONE HCL PF 1 MG/ML IJ SOLN: 0.2500 mg | INTRAMUSCULAR | Status: DC | PRN

## 2013-08-21 MED ORDER — SIMETHICONE 80 MG PO CHEW
80.0000 mg | CHEWABLE_TABLET | ORAL | Status: DC
  Administered 2013-08-22: 80 mg via ORAL
  Filled 2013-08-21 (×2): qty 1

## 2013-08-21 MED ORDER — DEXTROSE 5 % IV SOLN
1.0000 ug/kg/h | INTRAVENOUS | Status: DC | PRN
  Filled 2013-08-21: qty 2

## 2013-08-21 MED ORDER — PRENATAL MULTIVITAMIN CH
1.0000 | ORAL_TABLET | Freq: Every day | ORAL | Status: DC
  Administered 2013-08-22 – 2013-08-23 (×2): 1 via ORAL
  Filled 2013-08-21 (×2): qty 1

## 2013-08-21 MED ORDER — LACTATED RINGERS IV SOLN
INTRAVENOUS | Status: DC
  Administered 2013-08-21: 21:00:00 via INTRAVENOUS

## 2013-08-21 MED ORDER — OXYTOCIN 40 UNITS IN LACTATED RINGERS INFUSION - SIMPLE MED: 62.5000 mL/h | INTRAVENOUS | Status: AC

## 2013-08-21 MED ORDER — METOCLOPRAMIDE HCL 5 MG/ML IJ SOLN: 10.0000 mg | Freq: Three times a day (TID) | INTRAMUSCULAR | Status: DC | PRN

## 2013-08-21 MED ORDER — MORPHINE SULFATE (PF) 0.5 MG/ML IJ SOLN
INTRAMUSCULAR | Status: DC | PRN
  Administered 2013-08-21: .15 mg via INTRATHECAL

## 2013-08-21 MED ORDER — ONDANSETRON HCL 4 MG/2ML IJ SOLN
INTRAMUSCULAR | Status: AC
  Filled 2013-08-21: qty 2

## 2013-08-21 MED ORDER — SODIUM CHLORIDE 0.9 % IV SOLN: INTRAVENOUS | Status: DC

## 2013-08-21 MED ORDER — ZOLPIDEM TARTRATE 5 MG PO TABS: 5.0000 mg | ORAL_TABLET | Freq: Every evening | ORAL | Status: DC | PRN

## 2013-08-21 MED ORDER — SCOPOLAMINE 1 MG/3DAYS TD PT72
1.0000 | MEDICATED_PATCH | Freq: Once | TRANSDERMAL | Status: DC
  Administered 2013-08-21: 1.5 mg via TRANSDERMAL

## 2013-08-21 MED ORDER — CEFAZOLIN SODIUM-DEXTROSE 2-3 GM-% IV SOLR
2.0000 g | INTRAVENOUS | Status: AC
  Administered 2013-08-21: 2 g via INTRAVENOUS

## 2013-08-21 MED ORDER — SENNOSIDES-DOCUSATE SODIUM 8.6-50 MG PO TABS
2.0000 | ORAL_TABLET | ORAL | Status: DC
  Administered 2013-08-21 – 2013-08-22 (×2): 2 via ORAL
  Filled 2013-08-21 (×2): qty 2

## 2013-08-21 MED ORDER — DIPHENHYDRAMINE HCL 50 MG/ML IJ SOLN: 12.5000 mg | INTRAMUSCULAR | Status: DC | PRN

## 2013-08-21 MED ORDER — BUPIVACAINE IN DEXTROSE 0.75-8.25 % IT SOLN
INTRATHECAL | Status: DC | PRN
Start: 2013-08-21 — End: 2013-08-21
  Administered 2013-08-21: 1.6 mL via INTRATHECAL

## 2013-08-21 MED ORDER — ONDANSETRON HCL 4 MG/2ML IJ SOLN
4.0000 mg | Freq: Once | INTRAMUSCULAR | Status: AC | PRN
  Administered 2013-08-21: 4 mg via INTRAVENOUS

## 2013-08-21 MED ORDER — DIPHENHYDRAMINE HCL 50 MG/ML IJ SOLN: 25.0000 mg | INTRAMUSCULAR | Status: DC | PRN

## 2013-08-21 MED ORDER — LANOLIN HYDROUS EX OINT: 1.0000 "application " | TOPICAL_OINTMENT | CUTANEOUS | Status: DC | PRN

## 2013-08-21 MED ORDER — OXYCODONE-ACETAMINOPHEN 5-325 MG PO TABS: 1.0000 | ORAL_TABLET | ORAL | Status: DC | PRN

## 2013-08-21 MED ORDER — MEASLES, MUMPS & RUBELLA VAC ~~LOC~~ INJ
0.5000 mL | INJECTION | Freq: Once | SUBCUTANEOUS | Status: DC
  Filled 2013-08-21: qty 0.5

## 2013-08-21 MED ORDER — NALOXONE HCL 0.4 MG/ML IJ SOLN: 0.4000 mg | INTRAMUSCULAR | Status: DC | PRN

## 2013-08-21 SURGICAL SUPPLY — 35 items
BARRIER ADHS 3X4 INTERCEED (GAUZE/BANDAGES/DRESSINGS) ×2 IMPLANT
CLAMP CORD UMBIL (MISCELLANEOUS) IMPLANT
CLOTH BEACON ORANGE TIMEOUT ST (SAFETY) ×2 IMPLANT
CONTAINER PREFILL 10% NBF 15ML (MISCELLANEOUS) IMPLANT
DRAIN JACKSON PRT FLT 7MM (DRAIN) IMPLANT
DRAPE LG THREE QUARTER DISP (DRAPES) ×2 IMPLANT
DRSG OPSITE POSTOP 4X10 (GAUZE/BANDAGES/DRESSINGS) ×2 IMPLANT
DURAPREP 26ML APPLICATOR (WOUND CARE) ×2 IMPLANT
ELECT REM PT RETURN 9FT ADLT (ELECTROSURGICAL) ×2
ELECTRODE REM PT RTRN 9FT ADLT (ELECTROSURGICAL) ×1 IMPLANT
EVACUATOR SILICONE 100CC (DRAIN) IMPLANT
EXTRACTOR VACUUM M CUP 4 TUBE (SUCTIONS) IMPLANT
GLOVE BIO SURGEON STRL SZ7 (GLOVE) ×2 IMPLANT
GLOVE BIOGEL PI IND STRL 7.0 (GLOVE) ×1 IMPLANT
GLOVE BIOGEL PI INDICATOR 7.0 (GLOVE) ×1
GOWN STRL REUS W/TWL LRG LVL3 (GOWN DISPOSABLE) ×4 IMPLANT
HEMOSTAT SURGICEL 4X8 (HEMOSTASIS) ×2 IMPLANT
KIT ABG SYR 3ML LUER SLIP (SYRINGE) IMPLANT
NEEDLE HYPO 25X5/8 SAFETYGLIDE (NEEDLE) IMPLANT
NS IRRIG 1000ML POUR BTL (IV SOLUTION) ×2 IMPLANT
PACK C SECTION WH (CUSTOM PROCEDURE TRAY) ×2 IMPLANT
PAD OB MATERNITY 4.3X12.25 (PERSONAL CARE ITEMS) ×2 IMPLANT
RTRCTR C-SECT PINK 25CM LRG (MISCELLANEOUS) ×2 IMPLANT
STAPLER VISISTAT 35W (STAPLE) ×2 IMPLANT
SUT CHROMIC 2 0 SH (SUTURE) ×2 IMPLANT
SUT MNCRL 0 VIOLET CTX 36 (SUTURE) ×1 IMPLANT
SUT MONOCRYL 0 CTX 36 (SUTURE) ×1
SUT PDS AB 0 CTX 60 (SUTURE) ×2 IMPLANT
SUT PLAIN 2 0 XLH (SUTURE) ×2 IMPLANT
SUT VIC AB 0 CTX 36 (SUTURE) ×5
SUT VIC AB 0 CTX36XBRD ANBCTRL (SUTURE) ×5 IMPLANT
SUT VIC AB 4-0 KS 27 (SUTURE) ×2 IMPLANT
TOWEL OR 17X24 6PK STRL BLUE (TOWEL DISPOSABLE) ×2 IMPLANT
TRAY FOLEY CATH 14FR (SET/KITS/TRAYS/PACK) ×2 IMPLANT
WATER STERILE IRR 1000ML POUR (IV SOLUTION) IMPLANT

## 2013-08-21 NOTE — Anesthesia Procedure Notes (Signed)
Spinal  Patient location during procedure: OR Start time: 08/21/2013 9:30 AM Staffing Anesthesiologist: Brayton CavesJACKSON, Khylee Algeo Performed by: anesthesiologist  Preanesthetic Checklist Completed: patient identified, site marked, surgical consent, pre-op evaluation, timeout performed, IV checked, risks and benefits discussed and monitors and equipment checked Spinal Block Patient position: sitting Prep: DuraPrep Patient monitoring: heart rate, cardiac monitor, continuous pulse ox and blood pressure Approach: midline Location: L3-4 Injection technique: single-shot Needle Needle type: Sprotte  Needle gauge: 24 G Needle length: 9 cm Assessment Sensory level: T4 Additional Notes Patient identified.  Risk benefits discussed including failed block, incomplete pain control, headache, nerve damage, paralysis, blood pressure changes, nausea, vomiting, reactions to medication both toxic or allergic, and postpartum back pain.  Patient expressed understanding and wished to proceed.  All questions were answered.  Sterile technique used throughout procedure.  CSF was clear.  No parasthesia or other complications.  Please see nursing notes for vital signs.

## 2013-08-21 NOTE — Op Note (Signed)
Brenda Kelley PROCEDURE DATE: 08/21/2013  PREOPERATIVE DIAGNOSES: Intrauterine pregnancy at  [redacted]w[redacted]d weeks gestation; elective repeat cesarean c/s. prior c/s x2. hx of significant scar tissue on the lower uterine segment found on her repeat c/s.   POSTOPERATIVE DIAGNOSES: The same  PROCEDURE: Repeat Low Transverse Cesarean Section with vertical skin incision  SURGEON:  Dr. Elsie Lincoln  ASSISTANT:  Dr. Rulon Abide  ANESTHESIOLOGIST: Dr. Brayton Caves.   INDICATIONS: Brenda Kelley is a 33 y.o. [id number] at [redacted]w[redacted]d here for cesarean section secondary to the indications listed under preoperative diagnoses; please see preoperative note for further details.  The risks of cesarean section were discussed with the patient including but were not limited to: bleeding which may require transfusion or reoperation; infection which may require antibiotics; injury to bowel, bladder, ureters or other surrounding organs; injury to the fetus; need for additional procedures including hysterectomy in the event of a life-threatening hemorrhage; placental abnormalities wth subsequent pregnancies, incisional problems, thromboembolic phenomenon and other postoperative/anesthesia complications.   The patient concurred with the proposed plan, giving informed written consent for the procedure.    FINDINGS:  Viable female infant in cephalic presentation.  Apgars 9 and 9.  Clear amniotic fluid.  Intact placenta, three vessel cord.  Diffuse thickened uterine adhesions to the anterior abdominal wall with bladder adhesions to the upper lower uterine segment.  Normal fallopian tubes and ovaries bilaterally.  ANESTHESIA: Spinal INTRAVENOUS FLUIDS: 1000 ml ESTIMATED BLOOD LOSS: 800 ml URINE OUTPUT:  50 ml SPECIMENS: Placenta sent to L&D COMPLICATIONS: None immediate  PROCEDURE IN DETAIL:  The patient preoperatively received intravenous antibiotics and had sequential compression devices applied to her lower extremities.  She was  then taken to the operating room where spinal anesthesia was administered. She was then placed in a dorsal supine position with a leftward tilt, and prepped and draped in a sterile manner.  A foley catheter was placed into her bladder and attached to constant gravity.  After an adequate timeout was performed, a vertical skin incision was made with scalpel and carried through to the underlying layer of fascia. The fascia was incised in the midline, and this incision was extended bilaterally using the Mayo scissors.  The rectus muscles were separated in the midline bluntly and the peritoneum was entered bluntly. Thick uterine scarring was noted from the lower uterine segment to the anterior abdominal wall and a thick bladder adhesion was also noted.  Attention was turned to the lower uterine segment where a high transverse hysterotomy was made with a scalpel and extended bilaterally bluntly.  The infant was successfully delivered, the cord was clamped and cut and the infant was handed over to awaiting neonatology team. Uterine massage was then administered, and the placenta delivered intact with a three-vessel cord. The uterus was then cleared of clot and debris.  The hysterotomy was closed with 0 Vicryl in a running locked fashion, and an imbricating layer was also placed with 0 Vicryl. A figure of 8 was also placed for hemostasis. The pelvis was cleared of all clot and debris. Hemostasis was confirmed on all surfaces. Surgicel was placed as well to ensure continued hemostasis. A layer of intercede was also placed.   The fascia was then closed using 0 PDS in a running fashion.  The subcutaneous layer was irrigated, then reapproximated with 2-0 plain gut interrupted stitches.  The skin was closed using staples for cosmesis. The patient tolerated the procedure well. Sponge, lap, instrument and needle counts were correct x 2.  She was taken to the recovery room in stable condition.   Brenda Kelley, Redmond BasemanKELI L, MD

## 2013-08-21 NOTE — Preoperative (Signed)
Beta Blockers   Reason not to administer Beta Blockers:Not Applicable 

## 2013-08-21 NOTE — Op Note (Signed)
Present for entire procedure.  Attestation of Attending Supervision of Fellow: Evaluation and management procedures were performed by the Fellow under my supervision and collaboration. I have reviewed the Fellow's note and chart, and I agree with the management and plan.  

## 2013-08-21 NOTE — Anesthesia Preprocedure Evaluation (Signed)
Anesthesia Evaluation  Patient identified by MRN, date of birth, ID band Patient awake    Reviewed: Allergy & Precautions, H&P , NPO status , Patient's Chart, lab work & pertinent test results  Airway Mallampati: II TM Distance: >3 FB Neck ROM: full    Dental no notable dental hx.    Pulmonary neg pulmonary ROS,    Pulmonary exam normal       Cardiovascular negative cardio ROS      Neuro/Psych negative neurological ROS  negative psych ROS   GI/Hepatic negative GI ROS, Neg liver ROS,   Endo/Other  negative endocrine ROS  Renal/GU negative Renal ROS     Musculoskeletal   Abdominal Normal abdominal exam  (+)   Peds  Hematology negative hematology ROS (+)   Anesthesia Other Findings   Reproductive/Obstetrics (+) Pregnancy                           Anesthesia Physical Anesthesia Plan  ASA: II  Anesthesia Plan: Spinal   Post-op Pain Management:    Induction:   Airway Management Planned:   Additional Equipment:   Intra-op Plan:   Post-operative Plan:   Informed Consent: I have reviewed the patients History and Physical, chart, labs and discussed the procedure including the risks, benefits and alternatives for the proposed anesthesia with the patient or authorized representative who has indicated his/her understanding and acceptance.     Plan Discussed with: CRNA, Anesthesiologist and Surgeon  Anesthesia Plan Comments:         Anesthesia Quick Evaluation  

## 2013-08-21 NOTE — H&P (Addendum)
Brenda Kelley is a 33 y.o. female presenting for repeat cesarean section at 39 weeks.  Pt has history of thick adhesions involving the LUS and anterior abdominal wall.  Pt counseled for vertical skin incision.  History OB History   Grav Para Term Preterm Abortions TAB SAB Ect Mult Living   7 2 2  4 4    2      Past Medical History  Diagnosis Date  . Vaginal Pap smear, abnormal 2004  . Medical history non-contributory    Past Surgical History  Procedure Laterality Date  . Cesarean section      x 2  . Keloid removal  2010   Family History: family history includes Heart disease in her mother; Hypertension in her mother. Social History:  reports that she has never smoked. She has never used smokeless tobacco. She reports that she does not drink alcohol or use illicit drugs.   Prenatal Transfer Tool  Maternal Diabetes: No Genetic Screening: Quad is negative Maternal Ultrasounds/Referrals: Normal Fetal Ultrasounds or other Referrals:  None Maternal Substance Abuse:  No Significant Maternal Medications:  None Significant Maternal Lab Results:  None Other Comments:  [facility]  Review of Systems  Constitutional: Negative.   Respiratory: Negative.   Cardiovascular: Negative.       Blood pressure 118/77, pulse 97, temperature 98.4 F (36.9 C), temperature source Oral, resp. rate 20, last menstrual period 11/29/2012, SpO2 100.00%. Exam Physical Exam  Vitals reviewed. Constitutional: She is oriented to person, place, and time. She appears well-developed and well-nourished.  HENT:  Head: Normocephalic and atraumatic.  Eyes: Conjunctivae are normal.  Neck: Neck supple. No thyromegaly present.  Cardiovascular: Normal rate and regular rhythm.   Respiratory: Effort normal and breath sounds normal.  GI: Soft. There is no tenderness.  Musculoskeletal: She exhibits no edema.  Neurological: She is alert and oriented to person, place, and time.  Skin: Skin is warm  and dry.  Psychiatric: She has a normal mood and affect.    Prenatal labs: ABO, Rh: --/--/O POS, O POS (03/19 [id number]) Antibody: NEG (03/19 0925) Rubella: Immune (08/14 0000) RPR: NON REACTIVE (03/19 0925)  HBsAg: Negative (08/14 0000)  HIV: NON REACTIVE (03/10 0913)  GBS: Negative (03/04 0000)   Assessment/Plan: 32 [id number] at 39 weeks for rpt c/s  1-vertical skin incsion due to severe adhsions 2-The risks of cesarean section discussed with the patient included but were not limited to: bleeding which may require transfusion or reoperation; infection which may require antibiotics; injury to bowel, bladder, ureters or other surrounding organs; injury to the fetus; need for additional procedures including hysterectomy in the event of a life-threatening hemorrhage; placental abnormalities wth subsequent pregnancies, incisional problems, thromboembolic phenomenon and other postoperative/anesthesia complications. The patient concurred with the proposed plan, giving informed written consent for the procedure.  3-Out patient circ.    Brenda Kelley H. 08/21/2013, 9:13 AM

## 2013-08-21 NOTE — Transfer of Care (Signed)
Immediate Anesthesia Transfer of Care Note  Patient: Brenda Kelley  Procedure(s) Performed: Procedure(s) with comments: CESAREAN SECTION  (Bilateral) - Repeat  No bilateral tubal ligation performed  Patient Location: PACU  Anesthesia Type:Spinal  Level of Consciousness: awake, alert  and oriented  Airway & Oxygen Therapy: Patient Spontanous Breathing  Post-op Assessment: Report given to PACU RN and Post -op Vital signs reviewed and stable  Post vital signs: Reviewed and stable  Complications: No apparent anesthesia complications

## 2013-08-22 LAB — CBC
HCT: 29.4 % — ABNORMAL LOW (ref 36.0–46.0)
Hemoglobin: 9.5 g/dL — ABNORMAL LOW (ref 12.0–15.0)
MCH: 20.7 pg — ABNORMAL LOW (ref 26.0–34.0)
MCHC: 32.3 g/dL (ref 30.0–36.0)
MCV: 64.1 fL — AB (ref 78.0–100.0)
Platelets: 197 10*3/uL (ref 150–400)
RBC: 4.59 MIL/uL (ref 3.87–5.11)
RDW: 18.9 % — AB (ref 11.5–15.5)
WBC: 10.5 10*3/uL (ref 4.0–10.5)

## 2013-08-22 NOTE — Anesthesia Postprocedure Evaluation (Signed)
  Anesthesia Post-op Note  Anesthesia Post Note  Patient: Brenda Kelley  Procedure(s) Performed: Procedure(s) (LRB): CESAREAN SECTION  (Bilateral)  Anesthesia type: Spinal  Patient location: Mother/Baby  Post pain: Pain level controlled  Post assessment: Post-op Vital signs reviewed  Last Vitals:  Filed Vitals:   08/22/13 0549  BP: 103/63  Pulse: 80  Temp: 37.2 C  Resp: 20    Post vital signs: Reviewed  Level of consciousness: awake  Complications: No apparent anesthesia complications

## 2013-08-22 NOTE — Lactation Note (Signed)
This note was copied from the chart of Brenda Barnetta Hammersmithelida Prater. Lactation Consultation Note Follow up at 32 hours.  Mom reports wanting to breast and bottle and interested in pumping.  Encouraged mom to breast first and limit formula in bottle to encourage frequent breast feedings.  DEBP set up with instructions encouraged mom to pump every 3 hours if baby is not at breast to stimulate milk supply.  Dorris CarnesShields used are #27.  Mom to call for assist as needed.     Patient Name: Brenda Kelley ZOXWR'UToday's Date: 08/22/2013 Reason for consult: Follow-up assessment   Maternal Data Has patient been taught Hand Expression?: Yes  Feeding Length of feed: 0 min  LATCH Score/Interventions                      Lactation Tools Discussed/Used Pump Review: Setup, frequency, and cleaning Initiated by:: JS Date initiated:: 08/22/13   Consult Status Consult Status: Follow-up Date: 08/23/13 Follow-up type: In-patient    Brenda Kelley, Brenda Kelley 08/22/2013, 6:06 PM

## 2013-08-22 NOTE — Progress Notes (Signed)
Subjective: Postpartum Day #1: Cesarean Delivery Patient reports tolerating PO.  Breast and bottle; desires Mirena for contraception; foley out recently but has not voided yet; amb without difficulty.  Objective: Vital signs in last 24 hours: Temp:  [97.4 F (36.3 C)-98.9 F (37.2 C)] 98.9 F (37.2 C) (03/21 0549) Pulse Rate:  [56-97] 80 (03/21 0549) Resp:  [17-21] 20 (03/21 0549) BP: (85-118)/(51-79) 103/63 mmHg (03/21 0549) SpO2:  [97 %-100 %] 97 % (03/21 0549) Weight:  [104.3 kg (229 lb 15 oz)] 104.3 kg (229 lb 15 oz) (03/20 1206)  Physical Exam:  General: alert, cooperative and no distress Lochia: appropriate Uterine Fundus: firm Incision: vert skin inc with honeycomb and intact pressure dsg at base DVT Evaluation: No evidence of DVT seen on physical exam.   Recent Labs  08/20/13 0925 08/22/13 0619  HGB 9.9* 9.5*  HCT 29.3* 29.4*    Assessment/Plan: Status post Cesarean section. Doing well postoperatively.  Continue current care. Anticipate d/c in AM 3/22  Cam HaiSHAW, Makaylin Carlo 08/22/2013, 7:38 AM

## 2013-08-23 MED ORDER — IBUPROFEN 600 MG PO TABS: 600.0000 mg | ORAL_TABLET | Freq: Four times a day (QID) | ORAL | Status: DC

## 2013-08-23 MED ORDER — OXYCODONE-ACETAMINOPHEN 5-325 MG PO TABS: 1.0000 | ORAL_TABLET | ORAL | Status: DC | PRN

## 2013-08-23 NOTE — Discharge Instructions (Signed)
Postpartum Care After Cesarean Delivery °After you deliver your newborn (postpartum period), the usual stay in the hospital is 24 72 hours. If there were problems with your labor or delivery, or if you have other medical problems, you might be in the hospital longer.  °While you are in the hospital, you will receive help and instructions on how to care for yourself and your newborn during the postpartum period.  °While you are in the hospital: °· It is normal for you to have pain or discomfort from the incision in your abdomen. Be sure to tell your nurses when you are having pain, where the pain is located, and what makes the pain worse. °· If you are breastfeeding, you may feel uncomfortable contractions of your uterus for a couple of weeks. This is normal. The contractions help your uterus get back to normal size. °· It is normal to have some bleeding after delivery. °· For the first 1 3 days after delivery, the flow is red and the amount may be similar to a period. °· It is common for the flow to start and stop. °· In the first few days, you may pass some small clots. Let your nurses know if you begin to pass large clots or your flow increases. °· Do not  flush blood clots down the toilet before having the nurse look at them. °· During the next 3 10 days after delivery, your flow should become more watery and pink or brown-tinged in color. °· Ten to fourteen days after delivery, your flow should be a small amount of yellowish-white discharge. °· The amount of your flow will decrease over the first few weeks after delivery. Your flow may stop in 6 8 weeks. Most women have had their flow stop by 12 weeks after delivery. °· You should change your sanitary pads frequently. °· Wash your hands thoroughly with soap and water for at least 20 seconds after changing pads, using the toilet, or before holding or feeding your newborn. °· Your intravenous (IV) tubing will be removed when you are drinking enough fluids. °· The  urine drainage tube (urinary catheter) that was inserted before delivery may be removed within 6 8 hours after delivery or when feeling returns to your legs. You should feel like you need to empty your bladder within the first 6 8 hours after the catheter has been removed. °· In case you become weak, lightheaded, or faint, call your nurse before you get out of bed for the first time and before you take a shower for the first time. °· Within the first few days after delivery, your breasts may begin to feel tender and full. This is called engorgement. Breast tenderness usually goes away within 48 72 hours after engorgement occurs. You may also notice milk leaking from your breasts. If you are not breastfeeding, do not stimulate your breasts. Breast stimulation can make your breasts produce more milk. °· Spending as much time as possible with your newborn is very important. During this time, you and your newborn can feel close and get to know each other. Having your newborn stay in your room (rooming in) will help to strengthen the bond with your newborn. It will give you time to get to know your newborn and become comfortable caring for your newborn. °· Your hormones change after delivery. Sometimes the hormone changes can temporarily cause you to feel sad or tearful. These feelings should not last more than a few days. If these feelings last longer   than that, you should talk to your caregiver. °· If desired, talk to your caregiver about methods of family planning or contraception. °· Talk to your caregiver about immunizations. Your caregiver may want you to have the following immunizations before leaving the hospital: °· Tetanus, diphtheria, and pertussis (Tdap) or tetanus and diphtheria (Td) immunization. It is very important that you and your family (including grandparents) or others caring for your newborn are up-to-date with the Tdap or Td immunizations. The Tdap or Td immunization can help protect your newborn  from getting ill. °· Rubella immunization. °· Varicella (chickenpox) immunization. °· Influenza immunization. You should receive this annual immunization if you did not receive the immunization during your pregnancy. °Document Released: 02/13/2012 Document Reviewed: 02/13/2012 °[employer]® Patient Information ©2014 [employer]. ° °

## 2013-08-23 NOTE — Discharge Summary (Signed)
Obstetric Discharge Summary Reason for Admission: cesarean section Prenatal Procedures: none Intrapartum Procedures: High transverse C/S (repeat) with vertical skin incision Postpartum Procedures: none Complications-Operative and Postpartum: none except adhesions with surgery Hemoglobin  Date Value Ref Range Status  08/22/2013 9.5* 12.0 - 15.0 g/dL Final  16/10/960412/18/2014 9.1   Final     HCT  Date Value Ref Range Status  08/22/2013 29.4* 36.0 - 46.0 % Final  05/21/2013 29   Final  Hospital Course: Brenda Kelley is a 33 y.o. female presenting for repeat cesarean section at 39 weeks. Pt has history of thick adhesions involving the LUS and anterior abdominal wall. Pt counseled for vertical skin incision.  PROCEDURE DATE: 08/21/2013  PREOPERATIVE DIAGNOSES: Intrauterine pregnancy at 5140w0d weeks gestation; elective repeat cesarean c/s. prior c/s x2. hx of significant scar tissue on the lower uterine segment found on her repeat c/s.  POSTOPERATIVE DIAGNOSES: The same  PROCEDURE: Repeat Low Transverse Cesarean Section with vertical skin incision  SURGEON: Dr. Elsie LincolnKelly Leggett  ASSISTANT: Dr. Rulon AbideKeli Beck  ANESTHESIOLOGIST: Dr. Brayton CavesFreeman Jackson.  INDICATIONS: Brenda Pillingelida A Lafata is a 33 y.o. [id number] at 7540w0d here for cesarean section secondary to the indications listed under preoperative diagnoses; please see preoperative note for further details. The risks of cesarean section were discussed with the patient including but were not limited to: bleeding which may require transfusion or reoperation; infection which may require antibiotics; injury to bowel, bladder, ureters or other surrounding organs; injury to the fetus; need for additional procedures including hysterectomy in the event of a life-threatening hemorrhage; placental abnormalities wth subsequent pregnancies, incisional problems, thromboembolic phenomenon and other postoperative/anesthesia complications. The patient concurred with the proposed plan, giving  informed written consent for the procedure.  FINDINGS: Viable female infant in cephalic presentation. Apgars 9 and 9. Clear amniotic fluid. Intact placenta, three vessel cord. Diffuse thickened uterine adhesions to the anterior abdominal wall with bladder adhesions to the upper lower uterine segment. Normal fallopian tubes and ovaries bilaterally.  ANESTHESIA: Spinal  INTRAVENOUS FLUIDS: 1000 ml  ESTIMATED BLOOD LOSS: 800 ml  URINE OUTPUT: 50 ml  SPECIMENS: Placenta sent to L&D  COMPLICATIONS: None immediate  BECK, KELI L, MD   Has done well postoperatively. Only using Ibuprofen for pain Wants to go home early  Bottle and breastfeeding  Physical Exam:  General: alert, cooperative and no distress Lochia: appropriate Uterine Fundus: firm Incision: healing well, no significant drainage DVT Evaluation: No evidence of DVT seen on physical exam.  Discharge Diagnoses: Term Pregnancy-delivered  Discharge Information: Date: 08/23/2013 Activity: unrestricted and pelvic rest except no driving x 2 wks Diet: routine Medications: Ibuprofen and Percocet Condition: stable and improved Instructions: refer to practice specific booklet Discharge to: home   Newborn Data: Live born female  Birth Weight: 7 lb 11.3 oz (3496 g) APGAR: 9, 9  Home with mother.  [facility] CenterWILLIAMS,MARIE 08/23/2013, 6:48 AM

## 2013-08-23 NOTE — Progress Notes (Signed)
Consulted with Dr. Emelda FearFerguson regarding patient having honeycomb dressing with staples underneath.  Schedule [facility] nurse to go out to the home in 5 days for staple removal.  Order placed in Epic.

## 2013-08-24 ENCOUNTER — Encounter (HOSPITAL_COMMUNITY): Payer: Self-pay | Admitting: Obstetrics & Gynecology

## 2013-08-25 NOTE — Progress Notes (Signed)
Post discharge chart review completed.  

## 2013-09-18 ENCOUNTER — Ambulatory Visit: Payer: Medicaid Other | Admitting: Obstetrics & Gynecology

## 2013-09-18 ENCOUNTER — Telehealth: Payer: Self-pay | Admitting: General Practice

## 2013-09-18 NOTE — Telephone Encounter (Signed)
Patient no showed for pp visit today. Called patient and she stated that she was just trying to call to reschedule this. Told patient our front office has left for the day so I am unable to make an appt, but they will call you back with a new appt. Patient verbalized understanding and had no further questions

## 2013-10-23 ENCOUNTER — Telehealth: Payer: Self-pay

## 2013-10-23 ENCOUNTER — Ambulatory Visit: Payer: Medicaid Other | Admitting: Obstetrics and Gynecology

## 2013-10-23 NOTE — Telephone Encounter (Signed)
Called pt and asked pt if she remember that she had an appt today for pp visit.  Pt stated "o, shoot I forgot."  I informed pt that it is okay and to please call the office on Tuesday morning so that she can reschedule her appt.  Pt agreed.

## 2013-11-04 ENCOUNTER — Ambulatory Visit: Payer: Medicaid Other

## 2013-11-11 ENCOUNTER — Ambulatory Visit: Payer: Medicaid Other | Admitting: *Deleted

## 2013-11-11 DIAGNOSIS — R350 Frequency of micturition: Secondary | ICD-10-CM

## 2013-11-11 LAB — POCT URINALYSIS DIP (DEVICE)
Bilirubin Urine: NEGATIVE
Glucose, UA: NEGATIVE mg/dL
Hgb urine dipstick: NEGATIVE
KETONES UR: NEGATIVE mg/dL
LEUKOCYTES UA: NEGATIVE
Nitrite: NEGATIVE
PH: 7 (ref 5.0–8.0)
PROTEIN: NEGATIVE mg/dL
Specific Gravity, Urine: 1.015 (ref 1.005–1.030)
Urobilinogen, UA: 1 mg/dL (ref 0.0–1.0)

## 2013-11-11 NOTE — Progress Notes (Signed)
Brenda Kelley here for urinalysis- states she thought she might have a uti- c/o urinary frequency but denies pain with urination. Urinalysis ran and results negative- informed her of results. She then wondered if she had BV because she had it a while back.  Denies vaginal discharge or odor so we discussed she is not having symptoms of BV .States just has some pelvic discomfort. I informed her she will need to see a provider for that -  She has a postpartum appointment scheduled for 12/02/13. I instructed her to keep that appointment , go to [facility] if having severe symptoms.  I also asked if she could be pregnant- she stated no she could not because she is not sexually active.

## 2013-12-01 ENCOUNTER — Emergency Department (HOSPITAL_COMMUNITY)
Admission: EM | Admit: 2013-12-01 | Discharge: 2013-12-01 | Disposition: A | Discharge status: Home / Self Care | Payer: Medicaid Other | Attending: Emergency Medicine | Admitting: Emergency Medicine

## 2013-12-01 ENCOUNTER — Encounter (HOSPITAL_COMMUNITY): Payer: Self-pay | Admitting: Emergency Medicine

## 2013-12-01 ENCOUNTER — Emergency Department (HOSPITAL_COMMUNITY): Payer: Medicaid Other

## 2013-12-01 DIAGNOSIS — X58XXXA Exposure to other specified factors, initial encounter: Secondary | ICD-10-CM | POA: Insufficient documentation

## 2013-12-01 DIAGNOSIS — S62501A Fracture of unspecified phalanx of right thumb, initial encounter for closed fracture: Secondary | ICD-10-CM

## 2013-12-01 DIAGNOSIS — IMO0002 Reserved for concepts with insufficient information to code with codable children: Secondary | ICD-10-CM | POA: Insufficient documentation

## 2013-12-01 DIAGNOSIS — Y9389 Activity, other specified: Secondary | ICD-10-CM | POA: Insufficient documentation

## 2013-12-01 DIAGNOSIS — Z79899 Other long term (current) drug therapy: Secondary | ICD-10-CM | POA: Insufficient documentation

## 2013-12-01 DIAGNOSIS — Y9289 Other specified places as the place of occurrence of the external cause: Secondary | ICD-10-CM | POA: Insufficient documentation

## 2013-12-01 MED ORDER — IBUPROFEN 600 MG PO TABS: 600.0000 mg | ORAL_TABLET | Freq: Three times a day (TID) | ORAL | Status: DC | PRN

## 2013-12-01 NOTE — Discharge Instructions (Signed)
Read the information below.  Use the prescribed medication as directed.  Please discuss all new medications with your pharmacist.  You may return to the Emergency Department at any time for worsening condition or any new symptoms that concern you.  If you develop uncontrolled pain, weakness or numbness of the extremity, severe discoloration of the skin, or you are unable to move your finger, return to the ER for a recheck.       Finger Fracture Fractures of fingers are breaks in the bones of the fingers. There are many types of fractures. There are different ways of treating these fractures. Your health care provider will discuss the best way to treat your fracture. CAUSES Traumatic injury is the main cause of broken fingers. These include:  Injuries while playing sports.  Workplace injuries.  Falls. RISK FACTORS Activities that can increase your risk of finger fractures include:  Sports.  Workplace activities that involve machinery.  A condition called osteoporosis, which can make your bones less dense and cause them to fracture more easily. SIGNS AND SYMPTOMS The main symptoms of a broken finger are pain and swelling within 15 minutes after the injury. Other symptoms include:  Bruising of your finger.  Stiffness of your finger.  Numbness of your finger.  Exposed bones (compound fracture) if the fracture is severe. DIAGNOSIS  The best way to diagnose a broken bone is with X-ray imaging. Additionally, your health care provider will use this X-ray image to evaluate the position of the broken finger bones.  TREATMENT  Finger fractures can be treated with:   Nonreduction--This means the bones are in place. The finger is splinted without changing the positions of the bone pieces. The splint is usually left on for about a week to 10 days. This will depend on your fracture and what your health care provider thinks.  Closed reduction--The bones are put back into position without using  surgery. The finger is then splinted.  Open reduction and internal fixation--The fracture site is opened. Then the bone pieces are fixed into place with pins or some type of hardware. This is seldom required. It depends on the severity of the fracture. HOME CARE INSTRUCTIONS   Follow your health care provider's instructions regarding activities, exercises, and physical therapy.  Only take over-the-counter or prescription medicines for pain, discomfort, or fever as directed by your health care provider. SEEK MEDICAL CARE IF: You have pain or swelling that limits the motion or use of your fingers. SEEK IMMEDIATE MEDICAL CARE IF:  Your finger becomes numb. MAKE SURE YOU:   Understand these instructions.  Will watch your condition.  Will get help right away if you are not doing well or get worse. Document Released: 09/02/2000 Document Revised: 03/11/2013 Document Reviewed: 12/31/2012 [facility] Patient Information 2015 [employer], [place]. This information is not intended to replace advice given to you by your health care provider. Make sure you discuss any questions you have with your health care provider.
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
## 2013-12-01 NOTE — Progress Notes (Signed)
Orthopedic Tech Progress Note Patient Details:  Brenda Kelley 1981/02/18 [id number]  Ortho Devices Type of Ortho Device: Finger splint Ortho Device/Splint Location: RUE Ortho Device/Splint Interventions: Ordered;Application   Jennye MoccasinHughes, Anthony Craig 12/01/2013, 3:09 PM

## 2013-12-02 ENCOUNTER — Encounter: Payer: Self-pay | Admitting: Obstetrics & Gynecology

## 2013-12-02 ENCOUNTER — Ambulatory Visit (INDEPENDENT_AMBULATORY_CARE_PROVIDER_SITE_OTHER): Payer: Medicaid Other | Admitting: Obstetrics & Gynecology

## 2013-12-02 DIAGNOSIS — Z3009 Encounter for other general counseling and advice on contraception: Secondary | ICD-10-CM

## 2013-12-02 NOTE — Patient Instructions (Signed)
Levonorgestrel intrauterine device (IUD) What is this medicine? LEVONORGESTREL IUD (LEE voe nor jes trel) is a contraceptive (birth control) device. The device is placed inside the uterus by a healthcare professional. It is used to prevent pregnancy and can also be used to treat heavy bleeding that occurs during your period. Depending on the device, it can be used for 3 to 5 years. This medicine may be used for other purposes; ask your health care provider or pharmacist if you have questions. COMMON BRAND NAME(S): Mirena, Skyla What should I tell my health care provider before I take this medicine? They need to know if you have any of these conditions: -abnormal Pap smear -cancer of the breast, uterus, or cervix -diabetes -endometritis -genital or pelvic infection now or in the past -have more than one sexual partner or your partner has more than one partner -heart disease -history of an ectopic or tubal pregnancy -immune system problems -IUD in place -liver disease or tumor -problems with blood clots or take blood-thinners -use intravenous drugs -uterus of unusual shape -vaginal bleeding that has not been explained -an unusual or allergic reaction to levonorgestrel, other hormones, silicone, or polyethylene, medicines, foods, dyes, or preservatives -pregnant or trying to get pregnant -breast-feeding How should I use this medicine? This device is placed inside the uterus by a health care professional. Talk to your pediatrician regarding the use of this medicine in children. Special care may be needed. Overdosage: If you think you have taken too much of this medicine contact a poison control center or emergency room at once. NOTE: This medicine is only for you. Do not share this medicine with others. What if I miss a dose? This does not apply. What may interact with this medicine? Do not take this medicine with any of the following  medications: -amprenavir -bosentan -fosamprenavir This medicine may also interact with the following medications: -aprepitant -barbiturate medicines for inducing sleep or treating seizures -bexarotene -griseofulvin -medicines to treat seizures like carbamazepine, ethotoin, felbamate, oxcarbazepine, phenytoin, topiramate -modafinil -pioglitazone -rifabutin -rifampin -rifapentine -some medicines to treat HIV infection like atazanavir, indinavir, lopinavir, nelfinavir, tipranavir, ritonavir -St. John's wort -warfarin This list may not describe all possible interactions. Give your health care provider a list of all the medicines, herbs, non-prescription drugs, or dietary supplements you use. Also tell them if you smoke, drink alcohol, or use illegal drugs. Some items may interact with your medicine. What should I watch for while using this medicine? Visit your doctor or health care professional for regular check ups. See your doctor if you or your partner has sexual contact with others, becomes HIV positive, or gets a sexual transmitted disease. This product does not protect you against HIV infection (AIDS) or other sexually transmitted diseases. You can check the placement of the IUD yourself by reaching up to the top of your vagina with clean fingers to feel the threads. Do not pull on the threads. It is a good habit to check placement after each menstrual period. Call your doctor right away if you feel more of the IUD than just the threads or if you cannot feel the threads at all. The IUD may come out by itself. You may become pregnant if the device comes out. If you notice that the IUD has come out use a backup birth control method like condoms and call your health care provider. Using tampons will not change the position of the IUD and are okay to use during your period. What side effects may I   notice from receiving this medicine? Side effects that you should report to your doctor or  health care professional as soon as possible: -allergic reactions like skin rash, itching or hives, swelling of the face, lips, or tongue -fever, flu-like symptoms -genital sores -high blood pressure -no menstrual period for 6 weeks during use -pain, swelling, warmth in the leg -pelvic pain or tenderness -severe or sudden headache -signs of pregnancy -stomach cramping -sudden shortness of breath -trouble with balance, talking, or walking -unusual vaginal bleeding, discharge -yellowing of the eyes or skin Side effects that usually do not require medical attention (report to your doctor or health care professional if they continue or are bothersome): -acne -breast pain -change in sex drive or performance -changes in weight -cramping, dizziness, or faintness while the device is being inserted -headache -irregular menstrual bleeding within first 3 to 6 months of use -nausea This list may not describe all possible side effects. Call your doctor for medical advice about side effects. You may report side effects to FDA at 1-800-FDA-1088. Where should I keep my medicine? This does not apply. NOTE: This sheet is a summary. It may not cover all possible information. If you have questions about this medicine, talk to your doctor, pharmacist, or health care provider.  2015, Elsevier/Gold Standard. (2011-06-21 13:54:04) Contraception Choices Contraception (birth control) is the use of any methods or devices to prevent pregnancy. Below are some methods to help avoid pregnancy. HORMONAL METHODS   Contraceptive implant. This is a thin, plastic tube containing progesterone hormone. It does not contain estrogen hormone. Your health care provider inserts the tube in the inner part of the upper arm. The tube can remain in place for up to 3 years. After 3 years, the implant must be removed. The implant prevents the ovaries from releasing an egg (ovulation), thickens the cervical mucus to prevent sperm  from entering the uterus, and thins the lining of the inside of the uterus.  Progesterone-only injections. These injections are given every 3 months by your health care provider to prevent pregnancy. This synthetic progesterone hormone stops the ovaries from releasing eggs. It also thickens cervical mucus and changes the uterine lining. This makes it harder for sperm to survive in the uterus.  Birth control pills. These pills contain estrogen and progesterone hormone. They work by preventing the ovaries from releasing eggs (ovulation). They also cause the cervical mucus to thicken, preventing the sperm from entering the uterus. Birth control pills are prescribed by a health care provider.Birth control pills can also be used to treat heavy periods.  Minipill. This type of birth control pill contains only the progesterone hormone. They are taken every day of each month and must be prescribed by your health care provider.  Birth control patch. The patch contains hormones similar to those in birth control pills. It must be changed once a week and is prescribed by a health care provider.  Vaginal ring. The ring contains hormones similar to those in birth control pills. It is left in the vagina for 3 weeks, removed for 1 week, and then a new one is put back in place. The patient must be comfortable inserting and removing the ring from the vagina.A health care provider's prescription is necessary.  Emergency contraception. Emergency contraceptives prevent pregnancy after unprotected sexual intercourse. This pill can be taken right after sex or up to 5 days after unprotected sex. It is most effective the sooner you take the pills after having sexual intercourse. Most emergency contraceptive pills  are available without a prescription. Check with your pharmacist. Do not use emergency contraception as your only form of birth control. BARRIER METHODS   Female condom. This is a thin sheath (latex or rubber) that  is worn over the penis during sexual intercourse. It can be used with spermicide to increase effectiveness.  Female condom. This is a soft, loose-fitting sheath that is put into the vagina before sexual intercourse.  Diaphragm. This is a soft, latex, dome-shaped barrier that must be fitted by a health care provider. It is inserted into the vagina, along with a spermicidal jelly. It is inserted before intercourse. The diaphragm should be left in the vagina for 6 to 8 hours after intercourse.  Cervical cap. This is a round, soft, latex or plastic cup that fits over the cervix and must be fitted by a health care provider. The cap can be left in place for up to 48 hours after intercourse.  Sponge. This is a soft, circular piece of polyurethane foam. The sponge has spermicide in it. It is inserted into the vagina after wetting it and before sexual intercourse.  Spermicides. These are chemicals that kill or block sperm from entering the cervix and uterus. They come in the form of creams, jellies, suppositories, foam, or tablets. They do not require a prescription. They are inserted into the vagina with an applicator before having sexual intercourse. The process must be repeated every time you have sexual intercourse. INTRAUTERINE CONTRACEPTION  Intrauterine device (IUD). This is a T-shaped device that is put in a woman's uterus during a menstrual period to prevent pregnancy. There are 2 types:  Copper IUD. This type of IUD is wrapped in copper wire and is placed inside the uterus. Copper makes the uterus and fallopian tubes produce a fluid that kills sperm. It can stay in place for 10 years.  Hormone IUD. This type of IUD contains the hormone progestin (synthetic progesterone). The hormone thickens the cervical mucus and prevents sperm from entering the uterus, and it also thins the uterine lining to prevent implantation of a fertilized egg. The hormone can weaken or kill the sperm that get into the  uterus. It can stay in place for 3-5 years, depending on which type of IUD is used. PERMANENT METHODS OF CONTRACEPTION  Female tubal ligation. This is when the woman's fallopian tubes are surgically sealed, tied, or blocked to prevent the egg from traveling to the uterus.  Hysteroscopic sterilization. This involves placing a small coil or insert into each fallopian tube. Your doctor uses a technique called hysteroscopy to do the procedure. The device causes scar tissue to form. This results in permanent blockage of the fallopian tubes, so the sperm cannot fertilize the egg. It takes about 3 months after the procedure for the tubes to become blocked. You must use another form of birth control for these 3 months.  Female sterilization. This is when the female has the tubes that carry sperm tied off (vasectomy).This blocks sperm from entering the vagina during sexual intercourse. After the procedure, the man can still ejaculate fluid (semen). NATURAL PLANNING METHODS  Natural family planning. This is not having sexual intercourse or using a barrier method (condom, diaphragm, cervical cap) on days the woman could become pregnant.  Calendar method. This is keeping track of the length of each menstrual cycle and identifying when you are fertile.  Ovulation method. This is avoiding sexual intercourse during ovulation.  Symptothermal method. This is avoiding sexual intercourse during ovulation, using a thermometer  and ovulation symptoms.  Post-ovulation method. This is timing sexual intercourse after you have ovulated. Regardless of which type or method of contraception you choose, it is important that you use condoms to protect against the transmission of sexually transmitted infections (STIs). Talk with your health care provider about which form of contraception is most appropriate for you. Document Released: 05/21/2005 Document Revised: 05/26/2013 Document Reviewed: 11/13/2012 [facility] Patient  Information 2015 [place], [place]. This information is not intended to replace advice given to you by your health care provider. Make sure you discuss any questions you have with your health care provider.

## 2013-12-02 NOTE — Progress Notes (Signed)
Patient ID: Brenda Pillingelida A Malinak, female   DOB: 06-28-1980, 33 y.o.   MRN: [id number] Subjective:     Brenda Kelley is a 33 y.o. female who presents for a postpartum visit. She is 4 months  postpartum following a repeat c/s with vertical incision. I have fully reviewed the prenatal and intrapartum course. The delivery was at 39 gestational weeks. Outcome: repeat cesarean section, classical incision. Anesthesia: spinal. Postpartum course has been uncomplicated. Baby's course has been normal. Baby is feeding by breast fed initially now bottle. Bleeding no bleeding. Bowel function is normal. Bladder function is normal. Patient is not sexually active. Contraception method is abstinence. Postpartum depression screening: negative.  The following portions of the patient's history were reviewed and updated as appropriate: allergies, current medications, past family history, past medical history, past social history, past surgical history and problem list.  Review of Systems A comprehensive review of systems was negative.   Objective:    BP 107/73  Pulse 62  Temp(Src) 97.8 F (36.6 C) (Oral)  Resp 20  Ht 5\' 7"  (1.702 m)  Wt 235 lb 8 oz (106.822 kg)  BMI 36.88 kg/m2  LMP 11/27/2013  General:  alert and no distress           Abdomen: soft, non-tender; bowel sounds normal; no masses,  no organomegaly and incision clean, dry and intact                          Assessment:     3-4 months postpartum exam. Pap smear not done at today's visit.   Plan:    1. Contraception: none 2. Pt separated from spouse and does not feel the need for contraception 3. Follow up in: 1 year or as needed.

## 2013-12-02 NOTE — Progress Notes (Signed)
Patient ID: Brenda Kelley, female   DOB: 02-06-1981, 33 y.o.   MRN: [id number] Pt had ED visit yesterday due to broken thumb on Rt hand

## 2013-12-04 NOTE — ED Provider Notes (Signed)
Medical screening examination/treatment/procedure(s) were performed by non-physician practitioner and as supervising physician I was immediately available for consultation/collaboration.   EKG Interpretation None       Ethelda ChickMartha K Linker, MD 12/04/13 [phone]

## 2013-12-09 ENCOUNTER — Ambulatory Visit: Payer: Medicaid Other | Admitting: Family Medicine

## 2014-02-02 ENCOUNTER — Encounter (HOSPITAL_COMMUNITY): Payer: Self-pay | Admitting: Emergency Medicine

## 2014-02-02 ENCOUNTER — Emergency Department (HOSPITAL_COMMUNITY)
Admission: EM | Admit: 2014-02-02 | Discharge: 2014-02-03 | Disposition: A | Discharge status: Home / Self Care | Payer: Medicaid Other | Attending: Emergency Medicine | Admitting: Emergency Medicine

## 2014-02-02 DIAGNOSIS — Z9889 Other specified postprocedural states: Secondary | ICD-10-CM | POA: Insufficient documentation

## 2014-02-02 DIAGNOSIS — R109 Unspecified abdominal pain: Secondary | ICD-10-CM | POA: Insufficient documentation

## 2014-02-02 DIAGNOSIS — R071 Chest pain on breathing: Secondary | ICD-10-CM | POA: Diagnosis not present

## 2014-02-02 DIAGNOSIS — R0789 Other chest pain: Secondary | ICD-10-CM

## 2014-02-02 MED ORDER — IBUPROFEN 200 MG PO TABS
400.0000 mg | ORAL_TABLET | Freq: Once | ORAL | Status: AC
  Administered 2014-02-02: 400 mg via ORAL
  Filled 2014-02-02: qty 2

## 2014-02-02 NOTE — ED Notes (Signed)
Pt reports left sided abdominal "discomfort." Denies N/V/D, denies urinary symptoms. States "it feels like a growing pain." Denies taking anything for pain. Denies fever/chills. NAD. AO x4.

## 2014-02-03 ENCOUNTER — Emergency Department (HOSPITAL_COMMUNITY): Payer: Medicaid Other

## 2014-02-03 LAB — COMPREHENSIVE METABOLIC PANEL
ALBUMIN: 3.8 g/dL (ref 3.5–5.2)
ALT: 10 U/L (ref 0–35)
AST: 25 U/L (ref 0–37)
Alkaline Phosphatase: 60 U/L (ref 39–117)
Anion gap: 12 (ref 5–15)
BILIRUBIN TOTAL: 0.2 mg/dL — AB (ref 0.3–1.2)
BUN: 8 mg/dL (ref 6–23)
CHLORIDE: 102 meq/L (ref 96–112)
CO2: 27 mEq/L (ref 19–32)
Calcium: 9.5 mg/dL (ref 8.4–10.5)
Creatinine, Ser: 0.88 mg/dL (ref 0.50–1.10)
GFR calc Af Amer: >90 mL/min (ref >90)
GFR calc non Af Amer: 87 mL/min — ABNORMAL LOW (ref >90)
Glucose, Bld: 117 mg/dL — ABNORMAL HIGH (ref 70–99)
POTASSIUM: 4.5 meq/L (ref 3.7–5.3)
Sodium: 141 mEq/L (ref 137–147)
TOTAL PROTEIN: 8 g/dL (ref 6.0–8.3)

## 2014-02-03 LAB — CBC WITH DIFFERENTIAL/PLATELET
BASOS ABS: 0 10*3/uL (ref 0.0–0.1)
Basophils Relative: 0 % (ref 0–1)
EOS PCT: 2 % (ref 0–5)
Eosinophils Absolute: 0.1 10*3/uL (ref 0.0–0.7)
HCT: 36.3 % (ref 36.0–46.0)
Hemoglobin: 12 g/dL (ref 12.0–15.0)
Lymphocytes Relative: 31 % (ref 12–46)
Lymphs Abs: 2.1 10*3/uL (ref 0.7–4.0)
MCH: 23.1 pg — ABNORMAL LOW (ref 26.0–34.0)
MCHC: 33.1 g/dL (ref 30.0–36.0)
MCV: 69.8 fL — ABNORMAL LOW (ref 78.0–100.0)
MONOS PCT: 9 % (ref 3–12)
Monocytes Absolute: 0.6 10*3/uL (ref 0.1–1.0)
NEUTROS PCT: 58 % (ref 43–77)
Neutro Abs: 4.1 10*3/uL (ref 1.7–7.7)
PLATELETS: 339 10*3/uL (ref 150–400)
RBC: 5.2 MIL/uL — ABNORMAL HIGH (ref 3.87–5.11)
RDW: 15.6 % — ABNORMAL HIGH (ref 11.5–15.5)
WBC: 6.9 10*3/uL (ref 4.0–10.5)

## 2014-02-03 MED ORDER — IBUPROFEN 800 MG PO TABS: 800.0000 mg | ORAL_TABLET | Freq: Three times a day (TID) | ORAL | Status: DC

## 2014-02-03 NOTE — ED Provider Notes (Signed)
Medical screening examination/treatment/procedure(s) were performed by non-physician practitioner and as supervising physician I was immediately available for consultation/collaboration.   EKG Interpretation None       Kaylen Nghiem M Margaretta Chittum, MD 02/03/14 0430 

## 2014-02-03 NOTE — ED Notes (Signed)
No urine collected, patient is being discharged. Patient reports "she didn't need it".

## 2014-02-03 NOTE — Discharge Instructions (Signed)
Your chest wall discomfort is likely musculoskeletal in nature. We are not concerned about cardio or pulmonary pathologies at this time. Followup with your PCP as needed for further evaluation and management of your discomfort. He began to experience fevers, difficulty breathing, chest pain, please return to ED for further evaluation You may continue to take your ibuprofen as needed for the pain. Chest Wall Pain Chest wall pain is pain in or around the bones and muscles of your chest. It may take up to 6 weeks to get better. It may take longer if you must stay physically active in your work and activities.  CAUSES  Chest wall pain may happen on its own. However, it may be caused by:  A viral illness like the flu.  Injury.  Coughing.  Exercise.  Arthritis.  Fibromyalgia.  Shingles. HOME CARE INSTRUCTIONS   Avoid overtiring physical activity. Try not to strain or perform activities that cause pain. This includes any activities using your chest or your abdominal and side muscles, especially if heavy weights are used.  Put ice on the sore area.  Put ice in a plastic bag.  Place a towel between your skin and the bag.  Leave the ice on for 15-20 minutes per hour while awake for the first 2 days.  Only take over-the-counter or prescription medicines for pain, discomfort, or fever as directed by your caregiver. SEEK IMMEDIATE MEDICAL CARE IF:   Your pain increases, or you are very uncomfortable.  You have a fever.  Your chest pain becomes worse.  You have new, unexplained symptoms.  You have nausea or vomiting.  You feel sweaty or lightheaded.  You have a cough with phlegm (sputum), or you cough up blood. MAKE SURE YOU:   Understand these instructions.  Will watch your condition.  Will get help right away if you are not doing well or get worse. Document Released: 05/21/2005 Document Revised: 08/13/2011 Document Reviewed: 01/15/2011 [facility] Patient Information 2015  [place], [place]. This information is not intended to replace advice given to you by your health care provider. Make sure you discuss any questions you have with your health care provider.  Costochondritis Costochondritis, sometimes called Tietze syndrome, is a swelling and irritation (inflammation) of the tissue (cartilage) that connects your ribs with your breastbone (sternum). It causes pain in the chest and rib area. Costochondritis usually goes away on its own over time. It can take up to 6 weeks or longer to get better, especially if you are unable to limit your activities. CAUSES  Some cases of costochondritis have no known cause. Possible causes include:  Injury (trauma).  Exercise or activity such as lifting.  Severe coughing. SIGNS AND SYMPTOMS  Pain and tenderness in the chest and rib area.  Pain that gets worse when coughing or taking deep breaths.  Pain that gets worse with specific movements. DIAGNOSIS  Your health care provider will do a physical exam and ask about your symptoms. Chest X-rays or other tests may be done to rule out other problems. TREATMENT  Costochondritis usually goes away on its own over time. Your health care provider may prescribe medicine to help relieve pain. HOME CARE INSTRUCTIONS   Avoid exhausting physical activity. Try not to strain your ribs during normal activity. This would include any activities using chest, abdominal, and side muscles, especially if heavy weights are used.  Apply ice to the affected area for the first 2 days after the pain begins.  Put ice in a plastic bag.  Place a towel between your skin and the bag.  Leave the ice on for 20 minutes, 2-3 times a day.  Only take over-the-counter or prescription medicines as directed by your health care provider. SEEK MEDICAL CARE IF:  You have redness or swelling at the rib joints. These are signs of infection.  Your pain does not go away despite rest or medicine. SEEK IMMEDIATE  MEDICAL CARE IF:   Your pain increases or you are very uncomfortable.  You have shortness of breath or difficulty breathing.  You cough up blood.  You have worse chest pains, sweating, or vomiting.  You have a fever or persistent symptoms for more than 2-3 days.  You have a fever and your symptoms suddenly get worse. MAKE SURE YOU:   Understand these instructions.  Will watch your condition.  Will get help right away if you are not doing well or get worse. Document Released: 02/28/2005 Document Revised: 03/11/2013 Document Reviewed: 12/23/2012 [facility] Patient Information 2015 [place], [place]. This information is not intended to replace advice given to you by your health care provider. Make sure you discuss any questions you have with your health care provider.    Emergency Department Resource Guide 1) Find a Doctor and Pay Out of Pocket Although you won't have to find out who is covered by your insurance plan, it is a good idea to ask around and get recommendations. You will then need to call the office and see if the doctor you have chosen will accept you as a new patient and what types of options they offer for patients who are self-pay. Some doctors offer discounts or will set up payment plans for their patients who do not have insurance, but you will need to ask so you aren't surprised when you get to your appointment.  2) Contact Your Local Health Department Not all health departments have doctors that can see patients for sick visits, but many do, so it is worth a call to see if yours does. If you don't know where your local health department is, you can check in your phone book. The CDC also has a tool to help you locate your state's health department, and many state websites also have listings of all of their local health departments.  3) Find a Walk-in Clinic If your illness is not likely to be very severe or complicated, you may want to try a walk in clinic. These are  popping up all over the country in pharmacies, drugstores, and shopping centers. They're usually staffed by nurse practitioners or physician assistants that have been trained to treat common illnesses and complaints. They're usually fairly quick and inexpensive. However, if you have serious medical issues or chronic medical problems, these are probably not your best option.  No Primary Care Doctor: - Call Health Connect at  [phone] - they can help you locate a primary care doctor that  accepts your insurance, provides certain services, etc. - Physician Referral Service- [phone]  Chronic Pain Problems: Organization         Address  Phone   Notes  [facility]  [phone] Patients need to be referred by their primary care doctor.   Medication Assistance: Organization         Address  Phone   Notes  [facility] Medication [facility] [address] [place] [place], [place] [address] [phone] --Must be a resident of [facility] -- Must have NO insurance coverage whatsoever (no Medicaid/  Medicare, etc.) -- The pt. MUST have a primary care doctor that directs their care regularly and follows them in the community   Med[facility]ist  [phone]   [employer]  [phone]    Agencies that provide inexpensive medical care: Organization         Address  Phone   Notes  [facility]  [phone]   [facility]    [phone]   [facility] [address] [place], [place] [address] [phone]   [facility] [address] [place] [place] [phone]   Planned Parenthood    [phone]   [facility]    [phone]   [facility]  [address], [place] Phone:  [phone], Fax:  [phone] Hours of Operation:  9 am - 6 pm, M-F.  Also accepts Medicaid/Medicare and self-pay.  [facility]  [address], [place] Phone: [phone], Fax: [phone]. Hours of Operation:  8:30 am - 5:30 pm, M-F.  Also accepts Medicaid and self-pay.  [facility] [place] [address] [place] Phone: [phone]   [facility] [address] Natasha Bence [place], [place] [phone], Ext. [phone] Mondays & Thursdays: 7-9 AM.  First 15 patients are seen on a first come, first serve basis.    Medicaid-accepting [facility] Providers:  Organization         Address  Phone   Notes  [facility] [address], [place] [phone] Also accepts self-pay patients.  [facility] [address] Laurell Josephs [place], [place]  [phone]   [facility] [address], [place] [phone]   [facility] [address] [place] [phone]   Renaye Rakers [address], [place]   [phone] Only accepts [place] Access [place] patients after they have their name applied to their card.   Self-Pay (no insurance) in [facility]:  Organization         Address  Phone   Notes  Sickle Cell Patients, [facility] Internal Medicine [address] [place], [place] [phone]   [facility] [address] [place], [place] [phone]   [facility] [place]  [address], [place] [phone]   [facility]/Dr. Osei-Bonsu  [address], [place] or [address], [place] [phone] Phone number for both [place] and [place] locations is the same.  [facility] [address] [place] [phone]   [facility] [address] [place] or [address] [phone]   [facility] [address] [place] [phone], phone; [phone], fax Sees patients 1st and 3rd Saturday  of every month.  Must not qualify for public or private insurance (i.e. Medicaid, Medicare, [place] Health Choice, Veterans' Benefits)  Household income should be no more than 200% of the poverty level The clinic cannot treat you if you are pregnant or think you are pregnant  Sexually transmitted diseases are not treated at the clinic.    Dental Care: Organization         Address  Phone  Notes  [facility] Department of [facility] [address] [place], [place] [phone] Accepts children up to age 75 who are enrolled in [place] or [place] Health Choice; pregnant women with a Medicaid card; and children who have applied for Medicaid or [place] Health Choice, but were declined, whose parents can pay a reduced fee at time of service.  [facility]  [address], [place] [phone] Accepts children up to age 77 who are enrolled in [place] or [place] Health Choice; pregnant women with a Medicaid card; and children who have applied for Medicaid or [place] Health Choice, but were declined, whose parents can pay a reduced fee at time of service.  [facility]  [address] [place], [place] [phone] Patients are seen by appointment only. Walk-ins are not accepted. [facility] will see patients 45 years of age and older. Monday - Tuesday (8am-5pm) Most Wednesdays (8:30-5pm) $30 per visit, cash only  [facility]  [address], [facility] [phone] Patients are seen by appointment only. Walk-ins are not accepted. [facility] will see patients 84 years of age and older. One Wednesday Evening (Monthly: Volunteer Based).  $30 per visit, cash only  [employer]  [phone] for adults; Children under age 63, call Graduate Pediatric Dentistry at [phone]. Children aged 63-14, please call [phone] to request a pediatric application.   Dental services are provided in all areas of dental care including fillings, crowns and bridges, complete and partial dentures, implants, gum treatment, root canals, and extractions. Preventive care is also provided. Treatment is provided to both adults and children. Patients are selected via a lottery and there is often a waiting list.   [facility] [address] [place]  [phone] [email or web address]   [facility] [address] [place], [place] [phone], Ext. [phone] Second and Fourth Thursday of each month, opens at 6:30 AM; Clinic ends at 9 AM.  Patients are seen on a first-come first-served basis, and a limited number are seen during each clinic.   [facility]  [address] Ether Griffins [place], [place] [phone]   Eligibility Requirements You must have lived in [place], [place], or [place] counties for at least the last three months.   You cannot be eligible for state or federal sponsored [place], including [employer], [place], or [employer].   You generally cannot be eligible for healthcare insurance through your employer.    How to apply: Eligibility screenings are held every Tuesday and Wednesday afternoon from 1:00 pm until 4:00 pm. You do not need an appointment for the interview!  [facility] [address], [place], [place] [phone]   [facility]  [phone]   [facility]  [phone]   [facility]  [phone]    Behavioral Health Resources in the Community: Intensive Outpatient Programs Organization         Address  Phone  Notes  [facility] Services [address] [place], [place] [phone]   [facility] Outpatient [address] [place], [place] [phone]   [facility] [address] [place], [place]  [phone]   [facility] Mental Health [address]  [place], [place] [phone] or [phone]   Substance Abuse Resources Organization         Address  Phone  Notes  [facility]  [phone]   [facility]  [phone]   [facility]  [phone]   Floydene Flock  [phone]   Residential & Outpatient Substance Abuse Program  [phone]   Psychological Services Organization         Address  Phone  Notes  [facility] Behavioral Health  [phone]   [facility] Behavioral Services  [phone]   [facility] Mental Health [address], [place] [phone] or [phone]    Mobile Crisis Teams Organization         Address  Phone  Notes  [facility], Mobile Crisis Care Unit  [phone]   [facility]  [address] [place], [place] [phone]   Doristine Locks [address] [place] [phone]    Self-Help/Support Groups Organization         Address  Phone             Notes  [facility] [place] - variety of support groups  336- I7437963 Call for more information  Narcotics Anonymous (NA), [facility] [address], [employer] [place]  2 meetings at this location   Statistician         Address  Phone  Notes  [facility] [address],    [place]  [phone]   [facility]  [address], [place] [address], [place], [place] [phone]   [facility] [address] [place], [place] [phone] Admissions: 8am-3pm M-F  [facility] [address],    [place], [place] [phone]   [facility] [address] [place], [place], [place] [phone]   [facility] [address],  [place], [place] [phone]   [facility] - Intensive Outpatient [address] Laurell Josephs [address], [place], [place] [phone]   [facility] ([facility] [address] [place]  [place], [place]  [phone] or [phone]   [facility] ([facility]) [address] [place], [place] [phone] Accepts Medicaid  [facility] [address],  [place] [phone] Substance Abuse/Addiction Treatment   [facility] Organization         Address  Phone  Notes  [facility]  [phone]   Angie Fava, PhD [address] Ervin Knack [place], [place]   [phone] or [phone]   [facility] Behavioral   [address] [place], [place] [phone]   [facility] [address], [place], [place] [phone] Insurance/Medicaid/sponsorship through [facility] and Families [address]                                    [place], [place] [phone] Therapy/tele-psych/case  [facility] [address], [place] [phone]    Dr. Lolly Mustache  [phone]   [facility]  [employer] [facility] Dept. 1) [address], [place] 2) [address] [place] 3)  [address], [place] [phone]  [phone]   [facility] Child Abuse Hotline (507)764-8650 or [phone] (After Hours)

## 2014-02-03 NOTE — ED Provider Notes (Signed)
CSN: [id number]     Arrival date & time 02/02/14  2246 History   First MD Initiated Contact with Patient 02/02/14 2306     Chief Complaint  Patient presents with  . Abdominal Pain     (Consider location/radiation/quality/duration/timing/severity/associated sxs/prior Treatment) HPI Brenda Kelley is a 33 y.o. female who gave birth via C-section in March comes in for evaluation of chest pain. She reports that for the past week she has noticed a spot on her chest wall right below her left breast it has been uncomfortable. She likens it to the same pain you would have if you had rolled your ankle. She says that pushing on the spot makes it feel better. She has also been taking Motrin 400 mg for the past 4 days and is also made her feel better. She says that it is aggravated by deep inspiration and if she turns direction quickly. She denies any fevers, difficulty breathing, no recent travel, no recent surgery, no unilateral leg swelling. No history of DVT Past Medical History  Diagnosis Date  . Vaginal Pap smear, abnormal 2004  . Medical history non-contributory    Past Surgical History  Procedure Laterality Date  . Cesarean section      x 2  . Keloid removal  2010  . Cesarean section with bilateral tubal ligation Bilateral 08/21/2013    Procedure: CESAREAN SECTION ;  Surgeon: Lesly Dukes, MD;  Location: [facility];  Service: Obstetrics;  Laterality: Bilateral;  Repeat  No bilateral tubal ligation performed   Family History  Problem Relation Age of Onset  . Heart disease Mother   . Hypertension Mother    History  Substance Use Topics  . Smoking status: Never Smoker   . Smokeless tobacco: Never Used  . Alcohol Use: No   OB History   Grav Para Term Preterm Abortions TAB SAB Ect Mult Living   Review of Systems  Constitutional: Negative for fever.  HENT: Negative for congestion and sinus pressure.   Respiratory: Negative for cough and shortness of breath.    Cardiovascular: Negative for chest pain.  Gastrointestinal: Negative for nausea.  Genitourinary: Negative for dysuria.  Musculoskeletal: Positive for myalgias.  Skin: Negative for rash.  All other systems reviewed and are negative.     Allergies  Review of patient's allergies indicates no known allergies.  Home Medications   Prior to Admission medications   Medication Sig Start Date End Date Taking? Authorizing Provider  ibuprofen (ADVIL,MOTRIN) 200 MG tablet Take 200-400 mg by mouth every 6 (six) hours as needed (pain).   Yes Historical Provider, MD  ibuprofen (ADVIL,MOTRIN) 800 MG tablet Take 1 tablet (800 mg total) by mouth 3 (three) times daily. 02/03/14   Earle Gell Valisha Heslin, PA-C   BP 135/83  Pulse 90  Temp(Src) 99.2 F (37.3 C) (Oral)  Resp 18  Ht  (1.702 m)  Wt 230 lb (104.327 kg)  BMI 36.01 kg/m2  SpO2 100%  LMP 01/19/2014 Physical Exam  Nursing note and vitals reviewed. Constitutional:  Awake, alert, nontoxic appearance.  HENT:  Head: Atraumatic.  Eyes: Right eye exhibits no discharge. Left eye exhibits no discharge.  Neck: Neck supple.  Pulmonary/Chest: Effort normal. She exhibits no tenderness.  Abdominal: Soft. There is no tenderness. There is no rebound.  Musculoskeletal: She exhibits no tenderness.  Focus of chest discomfort is at the base of the left rib cage on the  midclavicular line. No bony step-offs appreciated. No other hematoma or lesions noted. Baseline ROM, no obvious new focal weakness.  Neurological:  Mental status and motor strength appears baseline for patient and situation.  Skin: No rash noted.  Psychiatric: She has a normal mood and affect.    ED Course  Procedures (including critical care time) Labs Review Labs Reviewed  CBC WITH DIFFERENTIAL - Abnormal; Notable for the following:    RBC 5.20 (*)    MCV 69.8 (*)    MCH 23.1 (*)    RDW 15.6 (*)    All other components within normal limits  COMPREHENSIVE METABOLIC PANEL -  Abnormal; Notable for the following:    Glucose, Bld 117 (*)    Total Bilirubin 0.2 (*)    GFR calc non Af Amer 87 (*)    All other components within normal limits  URINALYSIS, ROUTINE W REFLEX MICROSCOPIC  POC URINE PREG, ED    Imaging Review Dg Chest 2 View  02/03/2014   CLINICAL DATA:  r/o pneumothorax r/o pneumothorax  EXAM: CHEST - 2 VIEW  COMPARISON:  None available  FINDINGS: Lungs are clear. Heart size and mediastinal contours are within normal limits. No effusion.  No pneumothorax. Visualized skeletal structures are unremarkable.  IMPRESSION: No acute cardiopulmonary disease.   Electronically Signed   By: Oley Balm M.D.   On: 02/03/2014 01:00     EKG Interpretation None     Meds given in ED:  Medications  ibuprofen (ADVIL,MOTRIN) tablet 400 mg (400 mg Oral Given 02/02/14 2343)    New Prescriptions   IBUPROFEN (ADVIL,MOTRIN) 800 MG TABLET    Take 1 tablet (800 mg total) by mouth 3 (three) times daily.   Filed Vitals:   02/03/14 0000 02/03/14 0033 02/03/14 0045 02/03/14 0100  BP: 132/82 145/88 126/98 135/83  Pulse: 95 83 79 90  Temp:      TempSrc:      Resp: Height:      Weight:      SpO2: 96% 100% 100% 100%     MDM   Vitals stable - WNL -afebrile  PE ruled out via PERC criteria. Pt resting comfortably in ED. PE not consistent with any other emergent cardiopulmonary pathology. Discomfort reproducible with movement more consistent with MSK etiology.  Labwork essentially normal CXR showed no sign of acute cardiopulmonary abnormality, No PTX. Will DC with Ibuprofen for chest discomfort- pt requested Ibuprofen. Discussed f/u with PCP and return precautions, pt very amenable to plan.   Final diagnoses:  Chest wall discomfort  Prior to patient discharge, I discussed and reviewed this case with Dr.Otter         Sharlene Motts, PA-C 02/03/14 0121

## 2014-02-03 NOTE — ED Notes (Signed)
[place], [place], at the bedside.

## 2014-03-01 ENCOUNTER — Ambulatory Visit (INDEPENDENT_AMBULATORY_CARE_PROVIDER_SITE_OTHER): Payer: Medicaid Other | Admitting: Family Medicine

## 2014-03-01 ENCOUNTER — Encounter: Payer: Self-pay | Admitting: Family Medicine

## 2014-03-01 VITALS — BP 127/87 | HR 69 | Temp 98.2°F | Ht 67.0 in | Wt 250.0 lb

## 2014-03-01 DIAGNOSIS — E049 Nontoxic goiter, unspecified: Secondary | ICD-10-CM

## 2014-03-01 DIAGNOSIS — Z7189 Other specified counseling: Secondary | ICD-10-CM

## 2014-03-01 DIAGNOSIS — Z7689 Persons encountering health services in other specified circumstances: Secondary | ICD-10-CM

## 2014-03-01 DIAGNOSIS — E669 Obesity, unspecified: Secondary | ICD-10-CM | POA: Insufficient documentation

## 2014-03-01 DIAGNOSIS — D573 Sickle-cell trait: Secondary | ICD-10-CM

## 2014-03-01 DIAGNOSIS — E010 Iodine-deficiency related diffuse (endemic) goiter: Secondary | ICD-10-CM

## 2014-03-01 NOTE — Patient Instructions (Signed)
Good to meet you today!  I am placing a referral to our dietitian Dr Gerilyn Pilgrim. Give her a call to set up an appointment. Follow up with me when available for a well woman visit and pap smear. Follow up sooner if you have any new symptoms or issues to discuss. I am also providing information on birth control options and nexplanon. Let me know if you have questions about this.  Etonogestrel implant What is this medicine? ETONOGESTREL (et oh noe JES trel) is a contraceptive (birth control) device. It is used to prevent pregnancy. It can be used for up to 3 years. This medicine may be used for other purposes; ask your health care provider or pharmacist if you have questions. COMMON BRAND NAME(S): Implanon, Nexplanon What should I tell my health care provider before I take this medicine? They need to know if you have any of these conditions: -abnormal vaginal bleeding -blood vessel disease or blood clots -cancer of the breast, cervix, or liver -depression -diabetes -gallbladder disease -headaches -heart disease or recent heart attack -high blood pressure -high cholesterol -kidney disease -liver disease -renal disease -seizures -tobacco smoker -an unusual or allergic reaction to etonogestrel, other hormones, anesthetics or antiseptics, medicines, foods, dyes, or preservatives -pregnant or trying to get pregnant -breast-feeding How should I use this medicine? This device is inserted just under the skin on the inner side of your upper arm by a health care professional. Talk to your pediatrician regarding the use of this medicine in children. Special care may be needed. Overdosage: If you think you've taken too much of this medicine contact a poison control center or emergency room at once. Overdosage: If you think you have taken too much of this medicine contact a poison control center or emergency room at once. NOTE: This medicine is only for you. Do not share this medicine with  others. What if I miss a dose? This does not apply. What may interact with this medicine? Do not take this medicine with any of the following medications: -amprenavir -bosentan -fosamprenavir This medicine may also interact with the following medications: -barbiturate medicines for inducing sleep or treating seizures -certain medicines for fungal infections like ketoconazole and itraconazole -griseofulvin -medicines to treat seizures like carbamazepine, felbamate, oxcarbazepine, phenytoin, topiramate -modafinil -phenylbutazone -rifampin -some medicines to treat HIV infection like atazanavir, indinavir, lopinavir, nelfinavir, tipranavir, ritonavir -St. John's wort This list may not describe all possible interactions. Give your health care provider a list of all the medicines, herbs, non-prescription drugs, or dietary supplements you use. Also tell them if you smoke, drink alcohol, or use illegal drugs. Some items may interact with your medicine. What should I watch for while using this medicine? This product does not protect you against HIV infection (AIDS) or other sexually transmitted diseases. You should be able to feel the implant by pressing your fingertips over the skin where it was inserted. Tell your doctor if you cannot feel the implant. What side effects may I notice from receiving this medicine? Side effects that you should report to your doctor or health care professional as soon as possible: -allergic reactions like skin rash, itching or hives, swelling of the face, lips, or tongue -breast lumps -changes in vision -confusion, trouble speaking or understanding -dark urine -depressed mood -general ill feeling or flu-like symptoms -light-colored stools -loss of appetite, nausea -right upper belly pain -severe headaches -severe pain, swelling, or tenderness in the abdomen -shortness of breath, chest pain, swelling in a leg -signs of pregnancy -sudden  numbness or weakness  of the face, arm or leg -trouble walking, dizziness, loss of balance or coordination -unusual vaginal bleeding, discharge -unusually weak or tired -yellowing of the eyes or skin Side effects that usually do not require medical attention (Report these to your doctor or health care professional if they continue or are bothersome.): -acne -breast pain -changes in weight -cough -fever or chills -headache -irregular menstrual bleeding -itching, burning, and vaginal discharge -pain or difficulty passing urine -sore throat This list may not describe all possible side effects. Call your doctor for medical advice about side effects. You may report side effects to FDA at 1-800-FDA-1088. Where should I keep my medicine? This drug is given in a hospital or clinic and will not be stored at home. NOTE: This sheet is a summary. It may not cover all possible information. If you have questions about this medicine, talk to your doctor, pharmacist, or health care provider.  2015, Elsevier/Gold Standard. (2011-11-26 15:37:45)  Best,  Leona Singleton, MD

## 2014-03-01 NOTE — Progress Notes (Signed)
Patient ID: Brenda Kelley, female   DOB: 1980/06/06, 33 y.o.   MRN: [id number] Subjective:   CC: Patient presents today to establish care. Additionally, patient would like to discuss weight.  HPI:   Weight She was 240 lbs at start of pregnancy and dropped to 219 during the pregnancy and 215 with breastfeeding. When she stopped breastfeeding, weight increased to 250 lbs. She very much wants to lose the weight.  Review of Systems - Per HPI. Additionally no other complaints today.  PMH: HSV 2 infection hx Obesity Sickle cell trait  H/o c-sections x 3 (2005, 2008, 2015) H/o keloid surgery (multiple, most recent 2010)  No other hospitalization  Meds:  occasional Valtrex as needed mvi  Periods: LMP 02/14/14 (1st day), 5 days, 28-30 days, no spotting  [id number] (elective abortions; 3 term deliveries by c-section, healthy with no pregnancy problems per pt  BC: Condoms; interested and knows options but scared about cramping, readjusting, pain after clips from BTL. Does not want more kids. Feels she knows all options but upon further discussion she was unaware of nexplanon.  Allergies: NKDA  No pap smear found in system. Thinks had it years ago in 2007, abnormal, with negative f/u colposcopy  Breast US for fibroadenoma 2014 - Negative  FH: Mother: Alcohol abuse/dependency, h/o HTN, stroke, heart disease/failure (uncertain of specifics) Maternal aunt x 2 with breast cancer dx in 30s, 1 lived and 1 died, no genetic testing.  SH: Education up to 11th grade. Unemployed , looking for job No driver's license. Thinking about it but scared of other drivers.  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 No exercise regularly. No h/o tobacco use No drug use Social EtOH use. Does not feel at risk for STD Does feel safe in relationship.     Objective:  Physical Exam BP 127/87   Pulse 69  Temp(Src) 98.2 F (36.8 C) (Oral)  Ht  (1.702 m)  Wt 250 lb (113.399 kg)  BMI 39.15 kg/m2  LMP 02/15/2014 GEN: NAD, pleasant SKIN: Right ear keloid surgery with mild deformity with loss of tissue at helix;  Left ear keloids returning on helix CV: RRR, no m/r/g, 2+ bilateral radial pulses PULM: CTAB, normal effort EXTR: No LE edema or calf tenderness HEENT: AT/[place], PERRL, o/p clear, mildly enlarged thyroid vs neck fullness     Assessment:     Brenda Kelley is a 33 y.o. female here to establish care and discuss obesity.    Plan:     # See problem list and after visit summary for problem-specific plans.  # Health Maintenance:  - yearly physical due when pt available. Due for pap. Flu shot today. - Briefly discussed birth control. Currently not on birth control other than condoms. Does not wish to get pregnant.  - discussed risk of pregnancy is higher with condoms than with other forms of birth control.  - provided list of options and specific information about nexplanon per pt request.   Follow-up: Follow up when available for well woman visit.   Leona Singleton, MD [facility]

## 2014-03-02 ENCOUNTER — Encounter: Payer: Self-pay | Admitting: Family Medicine

## 2014-03-02 DIAGNOSIS — D573 Sickle-cell trait: Secondary | ICD-10-CM | POA: Insufficient documentation

## 2014-03-02 DIAGNOSIS — E010 Iodine-deficiency related diffuse (endemic) goiter: Secondary | ICD-10-CM | POA: Insufficient documentation

## 2014-03-02 NOTE — Assessment & Plan Note (Signed)
Asymptomatic. No h/o known disease with thyroid. Prefers to wait until yearly physical. - TSH at f/u for yearly physical. - Monitor for symptoms described to pt.

## 2014-03-02 NOTE — Assessment & Plan Note (Signed)
Obese, with normal vital signs. She is motivated towards diet and exercise to lose weight. - Referral placed to Dr Gerilyn PilgrimSykes for MNT to create plan to lose weight. - F/u with me next available to discuss.

## 2014-04-05 ENCOUNTER — Encounter: Payer: Self-pay | Admitting: Family Medicine

## 2014-04-21 ENCOUNTER — Ambulatory Visit: Payer: Medicaid Other | Admitting: Family Medicine

## 2014-04-23 ENCOUNTER — Encounter: Payer: Medicaid Other | Admitting: Family Medicine

## 2014-04-27 ENCOUNTER — Telehealth: Payer: Self-pay | Admitting: *Deleted

## 2014-04-27 MED ORDER — VALACYCLOVIR HCL 500 MG PO TABS
ORAL_TABLET | ORAL | Status: DC
Start: 2014-04-27 — End: 2015-05-05

## 2014-04-27 NOTE — Telephone Encounter (Signed)
Pt left message requesting Rx for Valtrex 500 mg to be faxed to her pharmacy on file. Called pt to verify her request (whether for suppression or outbreak). She stated that she is having symptoms of early outbreak. I advised that I will send Rx (per standing order) to her pharmacy.  Pt voiced understanding.

## 2014-05-13 ENCOUNTER — Other Ambulatory Visit (HOSPITAL_COMMUNITY)
Admission: RE | Admit: 2014-05-13 | Discharge: 2014-05-13 | Disposition: A | Discharge status: Home / Self Care | Payer: Medicaid Other | Source: Ambulatory Visit | Attending: Family Medicine | Admitting: Family Medicine

## 2014-05-13 ENCOUNTER — Encounter: Payer: Self-pay | Admitting: Family Medicine

## 2014-05-13 ENCOUNTER — Ambulatory Visit (INDEPENDENT_AMBULATORY_CARE_PROVIDER_SITE_OTHER): Payer: Medicaid Other | Admitting: Family Medicine

## 2014-05-13 ENCOUNTER — Ambulatory Visit (INDEPENDENT_AMBULATORY_CARE_PROVIDER_SITE_OTHER): Payer: Medicaid Other | Admitting: *Deleted

## 2014-05-13 VITALS — BP 119/77 | HR 66 | Temp 98.4°F | Ht 67.0 in | Wt 258.1 lb

## 2014-05-13 DIAGNOSIS — Z0000 Encounter for general adult medical examination without abnormal findings: Secondary | ICD-10-CM

## 2014-05-13 DIAGNOSIS — Z23 Encounter for immunization: Secondary | ICD-10-CM

## 2014-05-13 DIAGNOSIS — Z113 Encounter for screening for infections with a predominantly sexual mode of transmission: Secondary | ICD-10-CM | POA: Insufficient documentation

## 2014-05-13 DIAGNOSIS — Z20828 Contact with and (suspected) exposure to other viral communicable diseases: Secondary | ICD-10-CM

## 2014-05-13 DIAGNOSIS — Z202 Contact with and (suspected) exposure to infections with a predominantly sexual mode of transmission: Secondary | ICD-10-CM

## 2014-05-13 DIAGNOSIS — E049 Nontoxic goiter, unspecified: Secondary | ICD-10-CM

## 2014-05-13 DIAGNOSIS — E010 Iodine-deficiency related diffuse (endemic) goiter: Secondary | ICD-10-CM

## 2014-05-13 DIAGNOSIS — E669 Obesity, unspecified: Secondary | ICD-10-CM

## 2014-05-13 HISTORY — DX: Encounter for general adult medical examination without abnormal findings: Z00.00

## 2014-05-13 LAB — TSH: TSH: 0.831 u[IU]/mL (ref 0.350–4.500)

## 2014-05-13 LAB — POCT WET PREP (WET MOUNT): CLUE CELLS WET PREP WHIFF POC: POSITIVE

## 2014-05-13 LAB — LIPID PANEL
Cholesterol: 167 mg/dL (ref 0–200)
HDL: 53 mg/dL (ref >39)
LDL CALC: 96 mg/dL (ref 0–99)
TRIGLYCERIDES: 88 mg/dL (ref <150)
Total CHOL/HDL Ratio: 3.2 Ratio
VLDL: 18 mg/dL (ref 0–40)

## 2014-05-13 LAB — POCT GLYCOSYLATED HEMOGLOBIN (HGB A1C): HEMOGLOBIN A1C: 5.4

## 2014-05-13 NOTE — Assessment & Plan Note (Signed)
-   Contact us if ever notice abnormal new lump in breast - STD check today - Wants flu shot today; TDAP in 10 years. - Pap smears per pt normal, most recent Feb 2015. Due in 1-3 years per pt preference. - Giving list of birth control options.  - giving list of dentists - A1c and lipids given obesity.

## 2014-05-13 NOTE — Progress Notes (Signed)
Patient ID: Brenda Kelley, female   DOB: 1981/03/28, 33 y.o.   MRN: [id number] Subjective:   CC: Well woman visit  HPI:   As of 05/13/2014 : Colonoscopy: No FH colon cancer so not due for screening Mammogram: Mom's sister had breast cancer in 40-50s. Survived. 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 Does not bother her. Has not grown/changed. A1c normal checked in 2013, around 5. [id number]:Well-controlled STD check: wants today; has HSV2; in March 2015 had chlamydia - got treated Pap smears: Normal in Feb this year. Lipids: never checked. Depression screen: None Falls risk assessment: None Smoking status: Nonsmoker, no drugs; Alcohol - occasional; once every 1-2 months 3-4 drinks each time. TDAP, Zostavax, pneumococcal, influenza: Wants flu shot today.  TDAP 2015 during pregnancy. Regular eye checkups: coming up next week Dental: Sees someone; would like to switch Review of weight: up 20 lbs since July. Stopped breastfeeding in March. Home not at work for a while. Walking 25 minutes 3 days weekly; drinking more water; portion control. Has lost weight in the past. LMP: Dec 1, regular, using condoms and interested in other options.  SH: [place] husband and 3 kids Sales associate at [employer]  Review of Systems - Per HPI. No meds regular basis NKDA  PMH - HSV2, obesity, s/p c-section, sickle cell trait, thyromegaly    Objective:  Physical Exam BP 119/77 mmHg  Pulse 66  Temp(Src) 98.4 F (36.9 C) (Oral)  Ht 5\' 7"  (1.702 m)  Wt 258 lb 1.6 oz (117.073 kg)  BMI 40.41 kg/m2  LMP 05/04/2014 GEN: NAD CV: RRR, no m/r/g, 2+ B radial pulses PULM: CTAB, normal effort ABD: S/NT/ND, obese SKIN: Keloids left ear and abdominal c-section scar EXTR: No LE edema or calf tenderness NEURO: Awake, alert, no focal deficits HEENT: Thick neck at thyriod region, no discrete masses or nodules GU: Thin whitish malodoruos dishcarge, otherwise vaginal mucosa WNL; cervix partially visualized  though very posterior, no CMT, uterus and adnexae normal mobility and size    Assessment:     Brenda Kelley is a 33 y.o. female here for well woman visit.    Plan:     # See problem list and after visit summary for problem-specific plans.   # Health Maintenance:  - Contact us if ever notice abnormal new lump in breast - STD check today - Wants flu shot today; TDAP in 10 years. - Pap smears per pt normal, most recent Feb 2015. Due in 1-3 years per pt preference. - Giving list of birth control options.  - giving list of dentists - A1c and lipids given obesity.   Follow-up: Follow up PRN.   Leona SingletonMaria T Delaney Schnick, MD [facility]

## 2014-05-13 NOTE — Assessment & Plan Note (Signed)
Gained 20 lbs in last 5 months. Attributes to sodas and decreased activity, along with not breastfeeding anymore. - Checking lipids and A1c today - Reiterated contacting Dr Gerilyn PilgrimSykes - gave card again. - Cut back on sodas. DM diet and DASH diet information provided.

## 2014-05-13 NOTE — Assessment & Plan Note (Signed)
Pt unaware of thyromegaly today, but feels she has fat on her neck; no discrete masses; no mention of hyper/hypothyroid symptoms. - TSH today.

## 2014-05-13 NOTE — Patient Instructions (Signed)
Good to see you today.  We are doing labwork and I will call you if it is NOT normal.  Keep working on weight loss. Great job. I am giving you Dr Gerilyn PilgrimSykes number - call her to set up an appointment.  You are getting a flu shot today.  I am giving you the diabetic diet recommendations (also general good recommendations) and the DASH diet recommendations just for some reading.  I am also giving you birth control options. Come in to talk to me if you have more questions or have decided on an option.    Diet Recommendations for Diabetes   Starchy (carb) foods include: Bread, rice, pasta, potatoes, corn, crackers, bagels, muffins, all baked goods.  (Fruits, milk, and yogurt also have carbohydrate, but most of these foods will not spike your blood sugar as the starchy foods will.)  A few fruits do cause high blood sugars; use small portions of bananas (limit to 1/2 at a time), grapes, and most tropical fruits.    Protein foods include: Meat, fish, poultry, eggs, dairy foods, and beans such as pinto and kidney beans (beans also provide carbohydrate).   1. Eat at least 3 meals and 1-2 snacks per day. Never go more than 4-5 hours while awake without eating.  2. Limit starchy foods to TWO per meal and ONE per snack. ONE portion of a starchy  food is equal to the following:   - ONE slice of bread (or its equivalent, such as half of a hamburger bun).   - 1/2 cup of a "scoopable" starchy food such as potatoes or rice.   - 15 grams of carbohydrate as shown on food label.  3. Both lunch and dinner should include a protein food, a carb food, and vegetables.   - Obtain twice as many veg's as protein or carbohydrate foods for both lunch and dinner.   - Fresh or frozen veg's are best.   - Try to keep frozen veg's on hand for a quick vegetable serving.    4. Breakfast should always include protein.    DASH Eating Plan DASH stands for "Dietary Approaches to Stop Hypertension." The DASH eating plan is a  healthy eating plan that has been shown to reduce high blood pressure (hypertension). Additional health benefits may include reducing the risk of type 2 diabetes mellitus, heart disease, and stroke. The DASH eating plan may also help with weight loss. WHAT DO I NEED TO KNOW ABOUT THE DASH EATING PLAN? For the DASH eating plan, you will follow these general guidelines:  Choose foods with a percent daily value for sodium of less than 5% (as listed on the food label).  Use salt-free seasonings or herbs instead of table salt or sea salt.  Check with your health care provider or pharmacist before using salt substitutes.  Eat lower-sodium products, often labeled as "lower sodium" or "no salt added."  Eat fresh foods.  Eat more vegetables, fruits, and low-fat dairy products.  Choose whole grains. Look for the word "whole" as the first word in the ingredient list.  Choose fish and skinless chicken or [place] more often than red meat. Limit fish, poultry, and meat to 6 oz (170 g) each day.  Limit sweets, desserts, sugars, and sugary drinks.  Choose heart-healthy fats.  Limit cheese to 1 oz (28 g) per day.  Eat more home-cooked food and less restaurant, buffet, and fast food.  Limit fried foods.  Cook foods using methods other than frying.  Limit  canned vegetables. If you do use them, rinse them well to decrease the sodium.  When eating at a restaurant, ask that your food be prepared with less salt, or no salt if possible. WHAT FOODS CAN I EAT? Seek help from a dietitian for individual calorie needs. Grains Whole grain or whole wheat bread. Brown rice. Whole grain or whole wheat pasta. Quinoa, bulgur, and whole grain cereals. Low-sodium cereals. Corn or whole wheat flour tortillas. Whole grain cornbread. Whole grain crackers. Low-sodium crackers. Vegetables Fresh or frozen vegetables (raw, steamed, roasted, or grilled). Low-sodium or reduced-sodium tomato and vegetable juices. Low-sodium  or reduced-sodium tomato sauce and paste. Low-sodium or reduced-sodium canned vegetables.  Fruits All fresh, canned (in natural juice), or frozen fruits. Meat and Other Protein Products Ground beef (85% or leaner), grass-fed beef, or beef trimmed of fat. Skinless chicken or [place]key. Ground chicken or [place]key. Pork trimmed of fat. All fish and seafood. Eggs. Dried beans, peas, or lentils. Unsalted nuts and seeds. Unsalted canned beans. Dairy Low-fat dairy products, such as skim or 1% milk, 2% or reduced-fat cheeses, low-fat ricotta or cottage cheese, or plain low-fat yogurt. Low-sodium or reduced-sodium cheeses. Fats and Oils Tub margarines without trans fats. Light or reduced-fat mayonnaise and salad dressings (reduced sodium). Avocado. Safflower, olive, or canola oils. Natural peanut or almond butter. Other Unsalted popcorn and pretzels. The items listed above may not be a complete list of recommended foods or beverages. Contact your dietitian for more options. WHAT FOODS ARE NOT RECOMMENDED? Grains White bread. White pasta. White rice. Refined cornbread. Bagels and croissants. Crackers that contain trans fat. Vegetables Creamed or fried vegetables. Vegetables in a cheese sauce. Regular canned vegetables. Regular canned tomato sauce and paste. Regular tomato and vegetable juices. Fruits Dried fruits. Canned fruit in light or heavy syrup. Fruit juice. Meat and Other Protein Products Fatty cuts of meat. Ribs, chicken wings, bacon, sausage, bologna, salami, chitterlings, fatback, hot dogs, bratwurst, and packaged luncheon meats. Salted nuts and seeds. Canned beans with salt. Dairy Whole or 2% milk, cream, half-and-half, and cream cheese. Whole-fat or sweetened yogurt. Full-fat cheeses or blue cheese. Nondairy creamers and whipped toppings. Processed cheese, cheese spreads, or cheese curds. Condiments Onion and garlic salt, seasoned salt, table salt, and sea salt. Canned and packaged gravies.  Worcestershire sauce. Tartar sauce. Barbecue sauce. Teriyaki sauce. Soy sauce, including reduced sodium. Steak sauce. Fish sauce. Oyster sauce. Cocktail sauce. Horseradish. Ketchup and mustard. Meat flavorings and tenderizers. Bouillon cubes. Hot sauce. Tabasco sauce. Marinades. Taco seasonings. Relishes. Fats and Oils Butter, stick margarine, lard, shortening, ghee, and bacon fat. Coconut, palm kernel, or palm oils. Regular salad dressings. Other Pickles and olives. Salted popcorn and pretzels. The items listed above may not be a complete list of foods and beverages to avoid. Contact your dietitian for more information. WHERE CAN I FIND MORE INFORMATION? National Heart, Lung, and Blood Institute: CablePromo.itwww.nhlbi.nih.gov/health/health-topics/topics/dash/ Document Released: 05/10/2011 Document Revised: 10/05/2013 Document Reviewed: 03/25/2013 [facility] Patient Information 2015 [employer] [place]. This information is not intended to replace advice given to you by your health care provider. Make sure you discuss any questions you have with your health care provider.

## 2014-05-14 ENCOUNTER — Telehealth: Payer: Self-pay | Admitting: Family Medicine

## 2014-05-14 LAB — CERVICOVAGINAL ANCILLARY ONLY
Chlamydia: NEGATIVE
Neisseria Gonorrhea: NEGATIVE

## 2014-05-14 LAB — RPR

## 2014-05-14 LAB — HIV ANTIBODY (ROUTINE TESTING W REFLEX): HIV: NONREACTIVE

## 2014-05-14 NOTE — Telephone Encounter (Signed)
Pt is aware of results. Abhiram Criado,CMA  

## 2014-05-14 NOTE — Telephone Encounter (Signed)
Please call to tell patient her wet prep showed BV. Let her know I can send in flagyl if she would like to be treated for this, and she should not drink alcohol while taking this. It is a common infection women can get causing discharge with no itching and is NOT an STD. Let me know her preference.  Other labs were normal (HIV, RPR, TSH, A1c, lipids (lifetime ASCVD risk 8%)).  Thanks,  Leona SingletonMaria T Talasia Saulter, MD

## 2014-05-19 ENCOUNTER — Telehealth: Payer: Self-pay | Admitting: Family Medicine

## 2014-05-19 NOTE — Telephone Encounter (Signed)
Pt called and said she would like to have the Flagyl called in. jw

## 2014-05-20 MED ORDER — METRONIDAZOLE 500 MG PO TABS: 500.0000 mg | ORAL_TABLET | Freq: Two times a day (BID) | ORAL | Status: AC

## 2014-05-20 NOTE — Telephone Encounter (Signed)
Rx sent.  Brenda SingletonMaria T Joyceann Kruser, MD

## 2014-06-09 ENCOUNTER — Ambulatory Visit: Payer: Medicaid Other | Admitting: Obstetrics & Gynecology

## 2014-06-09 ENCOUNTER — Telehealth: Payer: Self-pay

## 2014-06-09 NOTE — Telephone Encounter (Signed)
Called Brenda Kelley and asked Brenda Kelley if she knew of her appt scheduled today for her yearly pap smear.  Brenda Kelley stated "no, I didn't know can I reschedule?"  I informed Brenda Kelley that she could reschedule.  I transferred call to Erie NoeVanessa, registrar,  to schedule her appt.

## 2014-07-21 ENCOUNTER — Ambulatory Visit: Payer: Medicaid Other | Admitting: Obstetrics & Gynecology

## 2014-09-15 ENCOUNTER — Other Ambulatory Visit (HOSPITAL_COMMUNITY)
Admission: RE | Admit: 2014-09-15 | Discharge: 2014-09-15 | Disposition: A | Discharge status: Home / Self Care | Payer: Medicaid Other | Source: Ambulatory Visit | Attending: Sports Medicine | Admitting: Sports Medicine

## 2014-09-15 ENCOUNTER — Encounter: Payer: Self-pay | Admitting: Family Medicine

## 2014-09-15 ENCOUNTER — Ambulatory Visit (INDEPENDENT_AMBULATORY_CARE_PROVIDER_SITE_OTHER): Payer: Medicaid Other | Admitting: Family Medicine

## 2014-09-15 VITALS — BP 114/79 | HR 80 | Temp 98.4°F | Ht 67.0 in | Wt 267.2 lb

## 2014-09-15 DIAGNOSIS — Z0000 Encounter for general adult medical examination without abnormal findings: Secondary | ICD-10-CM | POA: Diagnosis not present

## 2014-09-15 DIAGNOSIS — Z1151 Encounter for screening for human papillomavirus (HPV): Secondary | ICD-10-CM | POA: Diagnosis present

## 2014-09-15 DIAGNOSIS — Z3009 Encounter for other general counseling and advice on contraception: Secondary | ICD-10-CM | POA: Insufficient documentation

## 2014-09-15 DIAGNOSIS — Z30018 Encounter for initial prescription of other contraceptives: Secondary | ICD-10-CM

## 2014-09-15 DIAGNOSIS — Z01419 Encounter for gynecological examination (general) (routine) without abnormal findings: Secondary | ICD-10-CM | POA: Insufficient documentation

## 2014-09-15 HISTORY — DX: Encounter for other general counseling and advice on contraception: Z30.09

## 2014-09-15 LAB — POCT URINE PREGNANCY: PREG TEST UR: NEGATIVE

## 2014-09-15 NOTE — Progress Notes (Signed)
One of the available preceptor. 

## 2014-09-15 NOTE — Assessment & Plan Note (Signed)
Birth control options discussed again. Patient had arrived prepared for Mirena but became very nervous just prior to procedure so we decided to hold off. Patient would like to think about this further. Discussed at length the procedures of placing Mirena and Nexplanon. Patient is interested in both options, but very nervous about her posterior cervix and pain with Mirena placement along with pain with Nexplanon placement and keloid scar afterwards. Currently using condoms. Gave more information on birth control options. She declines OCPs and Depo shots at this time due to history of weight gain with these. Discussed that condoms are not the most effective form of birth control but to use them regularly until she has decided. Urine pregnancy test negative today, prior to patient changing her mind.

## 2014-09-15 NOTE — Patient Instructions (Signed)
You got a Pap smear today. Be sure to follow up once you decide what type of birth control you would like. Until then, continue using condoms, knowing that this is not the best method of birth control but is much better than not using any form.   Levonorgestrel intrauterine device (IUD) What is this medicine? LEVONORGESTREL IUD (LEE voe nor jes trel) is a contraceptive (birth control) device. The device is placed inside the uterus by a healthcare professional. It is used to prevent pregnancy and can also be used to treat heavy bleeding that occurs during your period. Depending on the device, it can be used for 3 to 5 years. This medicine may be used for other purposes; ask your health care provider or pharmacist if you have questions. COMMON BRAND NAME(S): Elveria Royals What should I tell my health care provider before I take this medicine? They need to know if you have any of these conditions: -abnormal Pap smear -cancer of the breast, uterus, or cervix -diabetes -endometritis -genital or pelvic infection now or in the past -have more than one sexual partner or your partner has more than one partner -heart disease -history of an ectopic or tubal pregnancy -immune system problems -IUD in place -liver disease or tumor -problems with blood clots or take blood-thinners -use intravenous drugs -uterus of unusual shape -vaginal bleeding that has not been explained -an unusual or allergic reaction to levonorgestrel, other hormones, silicone, or polyethylene, medicines, foods, dyes, or preservatives -pregnant or trying to get pregnant -breast-feeding How should I use this medicine? This device is placed inside the uterus by a health care professional. Talk to your pediatrician regarding the use of this medicine in children. Special care may be needed. Overdosage: If you think you have taken too much of this medicine contact a poison control center or emergency room at once. NOTE:  This medicine is only for you. Do not share this medicine with others. What if I miss a dose? This does not apply. What may interact with this medicine? Do not take this medicine with any of the following medications: -amprenavir -bosentan -fosamprenavir This medicine may also interact with the following medications: -aprepitant -barbiturate medicines for inducing sleep or treating seizures -bexarotene -griseofulvin -medicines to treat seizures like carbamazepine, ethotoin, felbamate, oxcarbazepine, phenytoin, topiramate -modafinil -pioglitazone -rifabutin -rifampin -rifapentine -some medicines to treat HIV infection like atazanavir, indinavir, lopinavir, nelfinavir, tipranavir, ritonavir -St. John's wort -warfarin This list may not describe all possible interactions. Give your health care provider a list of all the medicines, herbs, non-prescription drugs, or dietary supplements you use. Also tell them if you smoke, drink alcohol, or use illegal drugs. Some items may interact with your medicine. What should I watch for while using this medicine? Visit your doctor or health care professional for regular check ups. See your doctor if you or your partner has sexual contact with others, becomes HIV positive, or gets a sexual transmitted disease. This product does not protect you against HIV infection (AIDS) or other sexually transmitted diseases. You can check the placement of the IUD yourself by reaching up to the top of your vagina with clean fingers to feel the threads. Do not pull on the threads. It is a good habit to check placement after each menstrual period. Call your doctor right away if you feel more of the IUD than just the threads or if you cannot feel the threads at all. The IUD may come out by itself. You may become pregnant if  the device comes out. If you notice that the IUD has come out use a backup birth control method like condoms and call your health care provider. Using  tampons will not change the position of the IUD and are okay to use during your period. What side effects may I notice from receiving this medicine? Side effects that you should report to your doctor or health care professional as soon as possible: -allergic reactions like skin rash, itching or hives, swelling of the face, lips, or tongue -fever, flu-like symptoms -genital sores -high blood pressure -no menstrual period for 6 weeks during use -pain, swelling, warmth in the leg -pelvic pain or tenderness -severe or sudden headache -signs of pregnancy -stomach cramping -sudden shortness of breath -trouble with balance, talking, or walking -unusual vaginal bleeding, discharge -yellowing of the eyes or skin Side effects that usually do not require medical attention (report to your doctor or health care professional if they continue or are bothersome): -acne -breast pain -change in sex drive or performance -changes in weight -cramping, dizziness, or faintness while the device is being inserted -headache -irregular menstrual bleeding within first 3 to 6 months of use -nausea This list may not describe all possible side effects. Call your doctor for medical advice about side effects. You may report side effects to FDA at 1-800-FDA-1088. Where should I keep my medicine? This does not apply. NOTE: This sheet is a summary. It may not cover all possible information. If you have questions about this medicine, talk to your doctor, pharmacist, or health care provider.  2015, Elsevier/Gold Standard. (2011-06-21 13:54:04)   Etonogestrel implant What is this medicine? ETONOGESTREL (et oh noe JES trel) is a contraceptive (birth control) device. It is used to prevent pregnancy. It can be used for up to 3 years. This medicine may be used for other purposes; ask your health care provider or pharmacist if you have questions. COMMON BRAND NAME(S): Implanon, Nexplanon What should I tell my health care  provider before I take this medicine? They need to know if you have any of these conditions: -abnormal vaginal bleeding -blood vessel disease or blood clots -cancer of the breast, cervix, or liver -depression -diabetes -gallbladder disease -headaches -heart disease or recent heart attack -high blood pressure -high cholesterol -kidney disease -liver disease -renal disease -seizures -tobacco smoker -an unusual or allergic reaction to etonogestrel, other hormones, anesthetics or antiseptics, medicines, foods, dyes, or preservatives -pregnant or trying to get pregnant -breast-feeding How should I use this medicine? This device is inserted just under the skin on the inner side of your upper arm by a health care professional. Talk to your pediatrician regarding the use of this medicine in children. Special care may be needed. Overdosage: If you think you've taken too much of this medicine contact a poison control center or emergency room at once. Overdosage: If you think you have taken too much of this medicine contact a poison control center or emergency room at once. NOTE: This medicine is only for you. Do not share this medicine with others. What if I miss a dose? This does not apply. What may interact with this medicine? Do not take this medicine with any of the following medications: -amprenavir -bosentan -fosamprenavir This medicine may also interact with the following medications: -barbiturate medicines for inducing sleep or treating seizures -certain medicines for fungal infections like ketoconazole and itraconazole -griseofulvin -medicines to treat seizures like carbamazepine, felbamate, oxcarbazepine, phenytoin, topiramate -modafinil -phenylbutazone -rifampin -some medicines to treat HIV infection like  atazanavir, indinavir, lopinavir, nelfinavir, tipranavir, ritonavir -St. John's wort This list may not describe all possible interactions. Give your health care provider  a list of all the medicines, herbs, non-prescription drugs, or dietary supplements you use. Also tell them if you smoke, drink alcohol, or use illegal drugs. Some items may interact with your medicine. What should I watch for while using this medicine? This product does not protect you against HIV infection (AIDS) or other sexually transmitted diseases. You should be able to feel the implant by pressing your fingertips over the skin where it was inserted. Tell your doctor if you cannot feel the implant. What side effects may I notice from receiving this medicine? Side effects that you should report to your doctor or health care professional as soon as possible: -allergic reactions like skin rash, itching or hives, swelling of the face, lips, or tongue -breast lumps -changes in vision -confusion, trouble speaking or understanding -dark urine -depressed mood -general ill feeling or flu-like symptoms -light-colored stools -loss of appetite, nausea -right upper belly pain -severe headaches -severe pain, swelling, or tenderness in the abdomen -shortness of breath, chest pain, swelling in a leg -signs of pregnancy -sudden numbness or weakness of the face, arm or leg -trouble walking, dizziness, loss of balance or coordination -unusual vaginal bleeding, discharge -unusually weak or tired -yellowing of the eyes or skin Side effects that usually do not require medical attention (Report these to your doctor or health care professional if they continue or are bothersome.): -acne -breast pain -changes in weight -cough -fever or chills -headache -irregular menstrual bleeding -itching, burning, and vaginal discharge -pain or difficulty passing urine -sore throat This list may not describe all possible side effects. Call your doctor for medical advice about side effects. You may report side effects to FDA at 1-800-FDA-1088. Where should I keep my medicine? This drug is given in a hospital or  clinic and will not be stored at home. NOTE: This sheet is a summary. It may not cover all possible information. If you have questions about this medicine, talk to your doctor, pharmacist, or health care provider.  2015, Elsevier/Gold Standard. (2011-11-26 15:37:45)

## 2014-09-15 NOTE — Progress Notes (Signed)
Patient ID: Deon Pillingelida A Luscher, female   DOB: December 08, 1980, 34 y.o.   MRN: [id number] Subjective:   CC: Pap smear and IUD  HPI:   Pap smear Patient has always had normal Pap smears and prefers to get this annually.  Birth control options She comes prepared to get an IUD placed, but upon discussing and bringing in equipment, she gets very nervous and would like to wait and think about it. She declines Nexplanon due to fear of needles and keloid scars, and declines OCPs and Depo shots due to fear of weight gain with history of this in the past.  Review of Systems - Per HPI.   PMH - HSV, obesity, thyromegaly, sickle cell trait, pertinent negative is no history of DVT or stroke or migraine with aura Smoking status: Nonsmoker FH: Mother with history of stroke in her 2260s with heart disease, no one with history of DVT    Objective:  Physical Exam BP 114/79 mmHg  Pulse 80  Temp(Src) 98.4 F (36.9 C) (Oral)  Ht 5\' 7"  (1.702 m)  Wt 267 lb 3.2 oz (121.201 kg)  BMI 41.84 kg/m2  LMP 08/25/2014 GEN: NAD Abdomen: Soft, nontender, nondistended GU: Normal external genitalia, normal vaginal mucosa and cervix very posterior but normal in appearance, moderate whitish discharge present, no lesions identified, bimanual exam with normal size and mobility of uterus and adnexa with no abnormal lesions palpated and no cervical motion tenderness Skin: No rash or cyanosis, however medium sized keloids are visualized anterior to patient's left ear and small keloid on mons pubis  Assessment:     Deon Pillingelida A Hottle is a 34 y.o. female here for Pap smear.    Plan:     # See problem list and after visit summary for problem-specific plans.  # Health maintenance: Pap smear performed today per patient preference. Prior Pap smear one year ago was normal.   Follow-up: Follow up when decision made for birth control.   Leona SingletonMaria T Tanette Chauca, MD [facility]

## 2014-09-16 LAB — CYTOLOGY - PAP

## 2014-09-21 ENCOUNTER — Encounter: Payer: Self-pay | Admitting: Family Medicine

## 2014-09-25 IMAGING — US US OB COMP +14 WK
1 series · 12 of 28 positions shown · non-contrast
Comparison: none

[Series 1: us ob comp +14 wk · 12 of 64 slices shown]
[im 3/64]
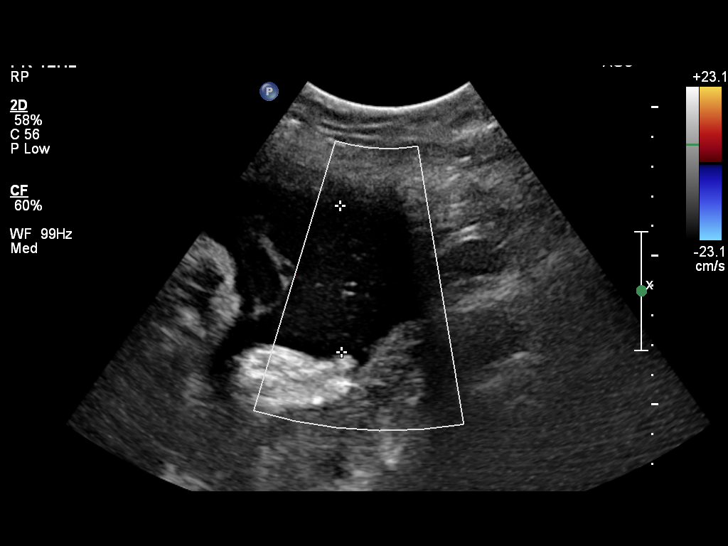
[im 8/64]
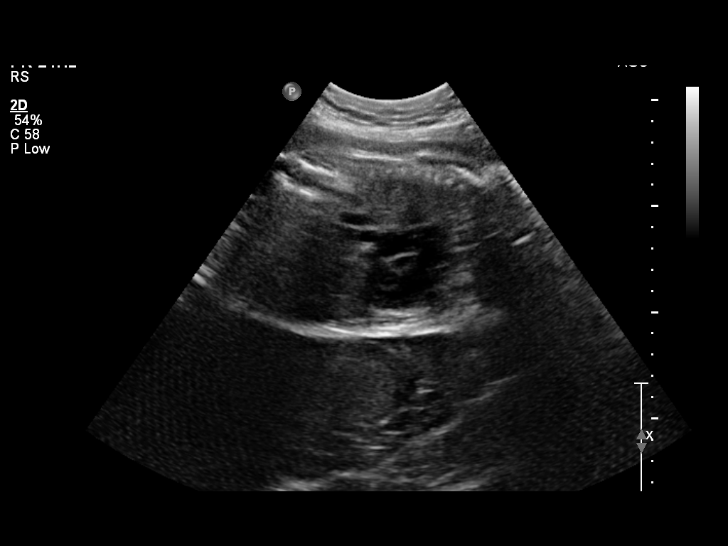
[im 12/64]
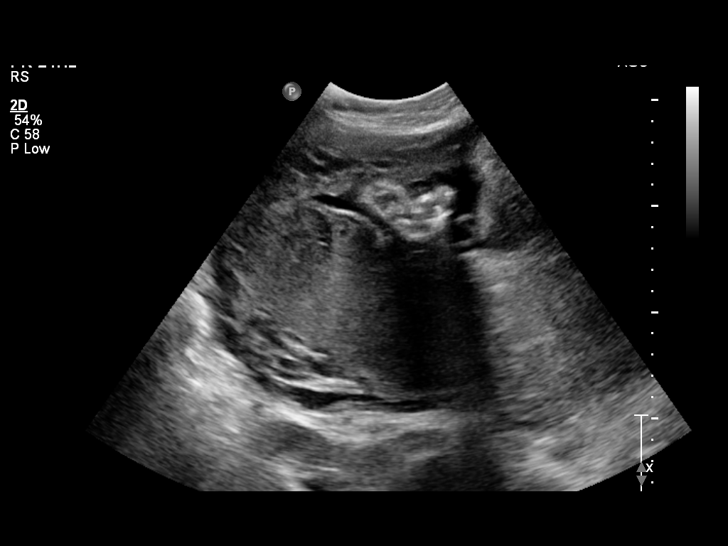
[im 19/64]
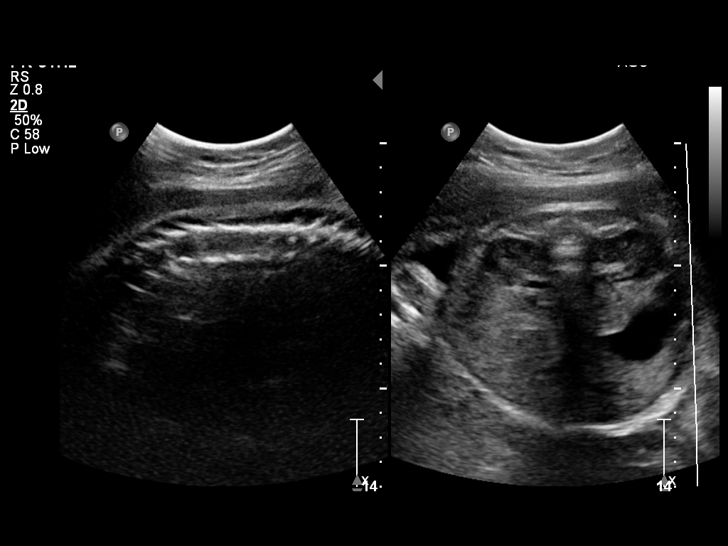
[im 24/64]
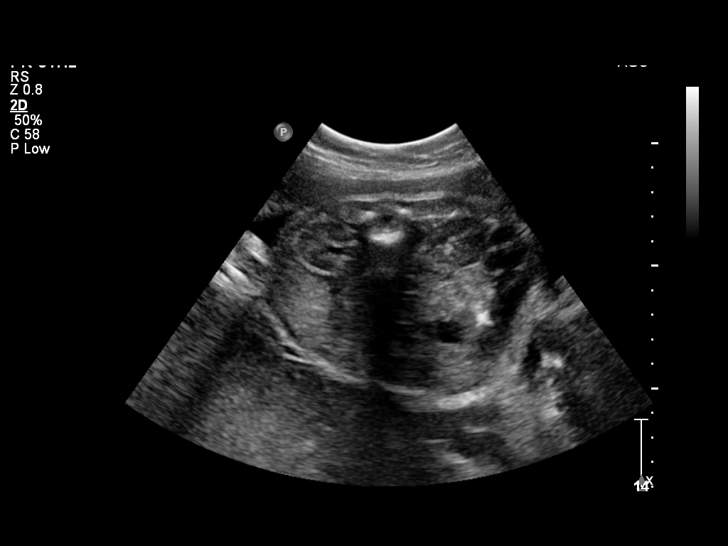
[im 29/64]
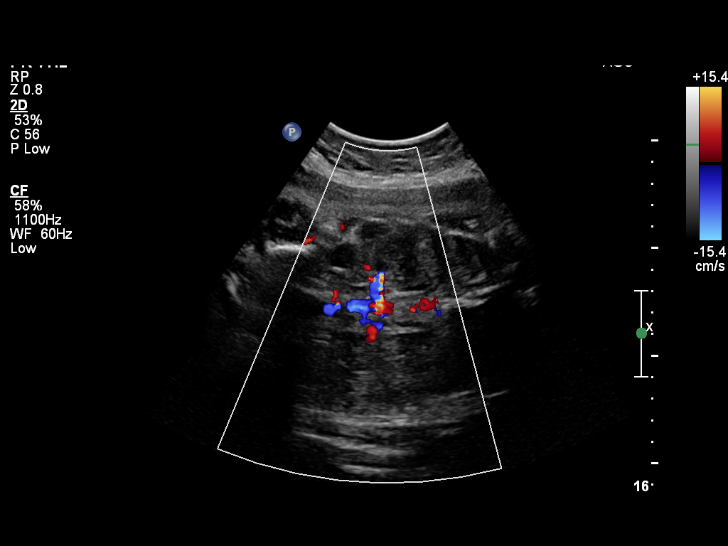
[im 36/64]
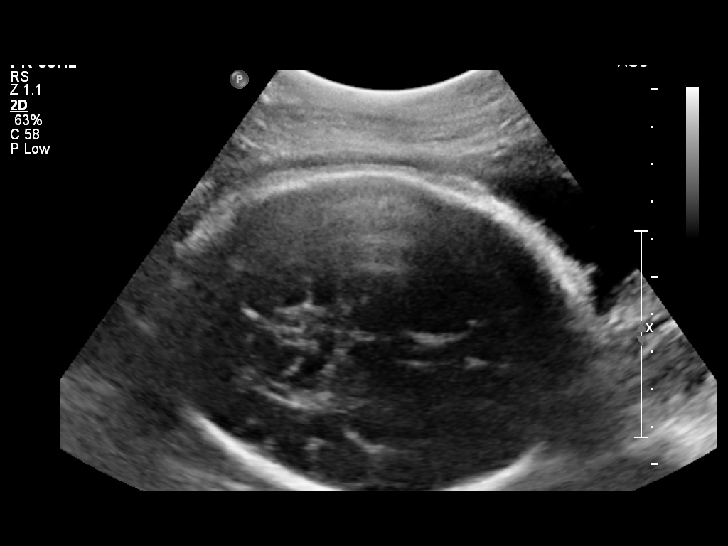
[im 40/64]
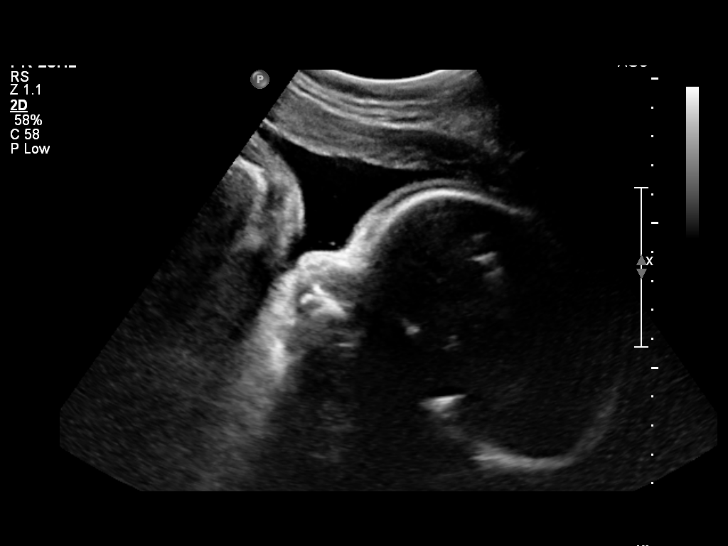
[im 45/64]
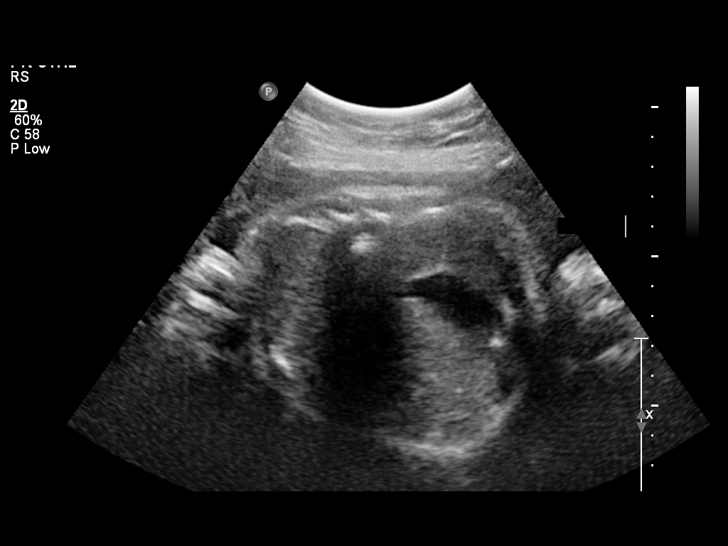
[im 52/64]
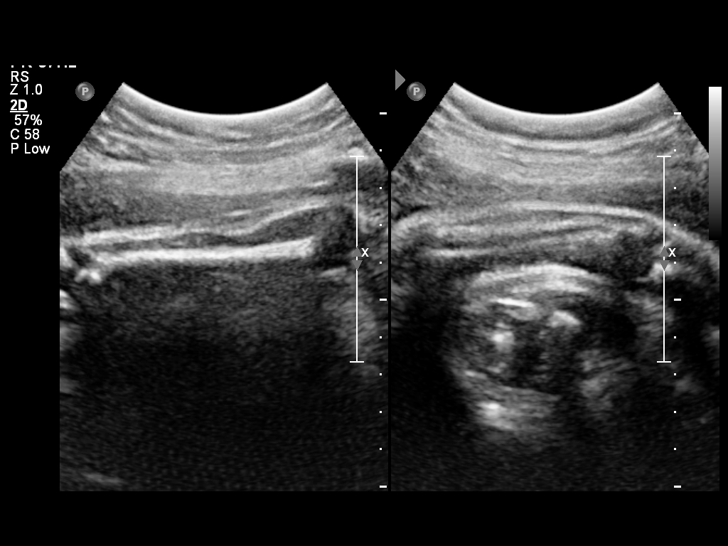
[im 57/64]
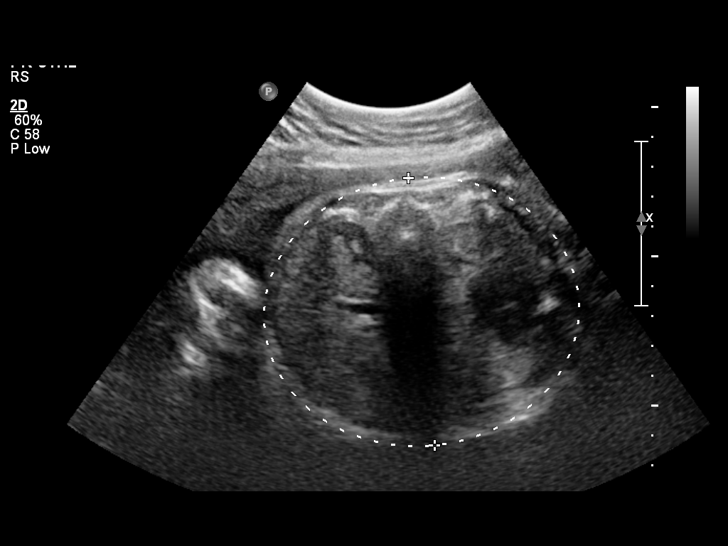
[im 61/64]
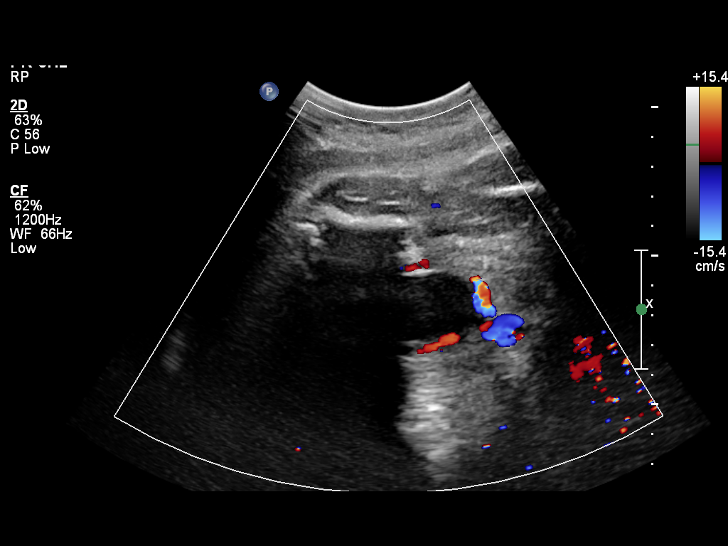

[12 of 28 positions shown; findings below may reference images not displayed]

OBSTETRICS REPORT
                      (Signed Final 07/23/2013 [DATE])

                                                         CNM
Service(s) Provided

 US OB COMP + 14 WK                                    76805.1
Indications

 Basic anatomic survey
 Previous cesarean section
Fetal Evaluation

 Num Of Fetuses:    1
 Fetal Heart Rate:  128                          bpm
 Cardiac Activity:  Observed
 Presentation:      Cephalic
 Placenta:          Posterior, above cervical
                    os
 P. Cord            Visualized, central
 Insertion:

 Amniotic Fluid
 AFI FV:      Subjectively within normal limits
 AFI Sum:     14.46   cm       52  %Tile     Larg Pckt:     4.9  cm
 RUQ:   1.74    cm   RLQ:    4.72   cm    LUQ:   3.1     cm   LLQ:    4.9    cm
Biometry

 BPD:     84.8  mm     G. Age:  34w 1d                CI:        69.17   70 - 86
                                                      FL/HC:      20.3   20.1 -

 HC:     325.6  mm     G. Age:  36w 6d       67  %    HC/AC:      1.04   0.93 -

 AC:     313.3  mm     G. Age:  35w 2d       68  %    FL/BPD:     78.1   71 - 87
 FL:      66.2  mm     G. Age:  34w 1d       23  %    FL/AC:      21.1   20 - 24
 HUM:     58.8  mm     G. Age:  34w 0d       49  %

 Est. FW:    4125  gm    5 lb 11 oz      64  %
Gestational Age

 LMP:           33w 5d        Date:  11/29/12                 EDD:   09/05/13
 U/S Today:     35w 1d                                        EDD:   08/26/13
 Best:          34w 6d     Det. By:  Early Ultrasound         EDD:   08/28/13
                                     (03/02/13)
Anatomy
 Cranium:          Appears normal         Aortic Arch:      Appears normal
 Fetal Cavum:      Appears normal         Ductal Arch:      Not well visualized
 Ventricles:       Appears normal         Diaphragm:        Not well visualized
 Choroid Plexus:   Appears normal         Stomach:          Appears normal
 Cerebellum:       Appears normal         Abdomen:          Appears normal
 Posterior Fossa:  Not well visualized    Abdominal Wall:   Not well visualized
 Nuchal Fold:      Not applicable (>20    Cord Vessels:     Not well visualized
                   wks GA)
 Face:             Orbits appear          Kidneys:          Appear normal
                   normal
 Lips:             Appears normal         Bladder:          Appears normal
 Heart:            Not well visualized    Spine:            Appears normal
 RVOT:             Not well visualized    Lower             Appears normal
                                          Extremities:
 LVOT:             Appears normal         Upper             Appears normal
                                          Extremities:

 Other:  Fetus appears to be a male. Technically difficult due to advanced GA.
Cervix Uterus Adnexa

 Cervix:       Not visualized (advanced GA >15wks)
 Uterus:       No abnormality visualized.
 Cul De Sac:   No free fluid seen.

 Left Ovary:    Not visualized.
 Right Ovary:   Not visualized.
 Adnexa:     No abnormality visualized.
Impression

 SIUP at 34+6 weeks
 No gross abnormalities identified
 Normal amniotic fluid volume
 Measurements consistent with early US; EFW at the 64th
 %tile
Recommendations

 Follow-up as clinically indicated

 Thank you for sharing in the care of Ms. JANIKAA MOJEED with
 questions or concerns.

## 2015-02-04 ENCOUNTER — Ambulatory Visit: Payer: Medicaid Other | Admitting: Family Medicine

## 2015-02-04 ENCOUNTER — Encounter (HOSPITAL_COMMUNITY): Payer: Self-pay | Admitting: *Deleted

## 2015-02-04 ENCOUNTER — Emergency Department (HOSPITAL_COMMUNITY)
Admission: EM | Admit: 2015-02-04 | Discharge: 2015-02-04 | Disposition: A | Discharge status: Home / Self Care | Payer: Medicaid Other | Attending: Emergency Medicine | Admitting: Emergency Medicine

## 2015-02-04 DIAGNOSIS — Z79899 Other long term (current) drug therapy: Secondary | ICD-10-CM | POA: Insufficient documentation

## 2015-02-04 DIAGNOSIS — Z862 Personal history of diseases of the blood and blood-forming organs and certain disorders involving the immune mechanism: Secondary | ICD-10-CM | POA: Insufficient documentation

## 2015-02-04 DIAGNOSIS — N3000 Acute cystitis without hematuria: Secondary | ICD-10-CM

## 2015-02-04 DIAGNOSIS — Z3202 Encounter for pregnancy test, result negative: Secondary | ICD-10-CM | POA: Insufficient documentation

## 2015-02-04 DIAGNOSIS — R300 Dysuria: Secondary | ICD-10-CM | POA: Diagnosis present

## 2015-02-04 LAB — URINALYSIS, ROUTINE W REFLEX MICROSCOPIC
Bilirubin Urine: NEGATIVE
GLUCOSE, UA: NEGATIVE mg/dL
KETONES UR: NEGATIVE mg/dL
Nitrite: NEGATIVE
PROTEIN: NEGATIVE mg/dL
Specific Gravity, Urine: 1.016 (ref 1.005–1.030)
Urobilinogen, UA: 1 mg/dL (ref 0.0–1.0)
pH: 6 (ref 5.0–8.0)

## 2015-02-04 LAB — POC URINE PREG, ED: PREG TEST UR: NEGATIVE

## 2015-02-04 LAB — URINE MICROSCOPIC-ADD ON

## 2015-02-04 LAB — WET PREP, GENITAL
CLUE CELLS WET PREP: NONE SEEN
Trich, Wet Prep: NONE SEEN
Yeast Wet Prep HPF POC: NONE SEEN

## 2015-02-04 LAB — GC/CHLAMYDIA PROBE AMP (~~LOC~~) NOT AT ARMC
Chlamydia: NEGATIVE
NEISSERIA GONORRHEA: NEGATIVE

## 2015-02-04 MED ORDER — CEPHALEXIN 500 MG PO CAPS: 500.0000 mg | ORAL_CAPSULE | Freq: Three times a day (TID) | ORAL | Status: DC

## 2015-02-04 MED ORDER — CEPHALEXIN 250 MG PO CAPS
500.0000 mg | ORAL_CAPSULE | Freq: Once | ORAL | Status: AC
  Administered 2015-02-04: 500 mg via ORAL
  Filled 2015-02-04: qty 2

## 2015-02-04 NOTE — ED Provider Notes (Signed)
CSN: [id number]     Arrival date & time 02/04/15  0404 History   First MD Initiated Contact with Patient 02/04/15 0515     Chief Complaint  Patient presents with  . Abdominal Pain      HPI Patient presents to emergency department with 48 hours of dysuria and urinary frequency with urgency.  No fevers or chills.  Some pain with intercourse.  No gross vaginal discharge.  No abnormal vaginal bleeding.  Denies lower abdominal discomfort or pain.  No flank pain.  Denies nausea vomiting.  Symptoms are moderate in severity.  No other complaints.   Past Medical History  Diagnosis Date  . Vaginal Pap smear, abnormal 2004  . Medical history non-contributory   . Sickle cell trait    Past Surgical History  Procedure Laterality Date  . Cesarean section with bilateral tubal ligation Bilateral 08/21/2013    Procedure: CESAREAN SECTION ;  Surgeon: Lesly Dukes, MD;  Location: [facility];  Service: Obstetrics;  Laterality: Bilateral;  Repeat  ; No bilateral tubal ligation performed  . Cesarean section      x 2  . Keloid removal     Family History  Problem Relation Age of Onset  . Heart disease Mother   . Hypertension Mother   . Alcohol abuse Mother   . Stroke Mother   . Cancer Maternal Aunt 40    cancer   Social History  Substance Use Topics  . Smoking status: Never Smoker   . Smokeless tobacco: Never Used  . Alcohol Use: Yes     Comment: socially   OB History    Gravida Para Term Preterm AB TAB SAB Ectopic Multiple Living   7 3 3  4 4    3      Review of Systems  All other systems reviewed and are negative.     Allergies  Review of patient's allergies indicates no known allergies.  Home Medications   Prior to Admission medications   Medication Sig Start Date End Date Taking? Authorizing Provider  Multiple Vitamins-Minerals (MULTIVITAMIN WITH MINERALS) tablet Take 1 tablet by mouth daily.   Yes Historical Provider, MD  valACYclovir (VALTREX) 500 MG tablet Take 1 tablet by  mouth twice daily for 5 days when symptoms begin 04/27/14  Yes Peggy Constant, MD  cephALEXin (KEFLEX) 500 MG capsule Take 1 capsule (500 mg total) by mouth 3 (three) times daily. 02/04/15   Azalia Bilis, MD   BP 109/63 mmHg  Pulse 72  Temp(Src) 99.2 F (37.3 C) (Oral)  Resp 16  SpO2 100% Physical Exam  Constitutional: She is oriented to person, place, and time. She appears well-developed and well-nourished. No distress.  HENT:  Head: Normocephalic and atraumatic.  Eyes: EOM are normal.  Neck: Normal range of motion.  Cardiovascular: Normal rate, regular rhythm and normal heart sounds.   Pulmonary/Chest: Effort normal and breath sounds normal.  Abdominal: Soft. She exhibits no distension. There is no tenderness.  Genitourinary:  Normal external genitalia.  Small amount of scant vaginal discharge.  No cervical erosions noted.  No cervical motion tenderness.  No adnexal masses or fullness.  Musculoskeletal: Normal range of motion.  Neurological: She is alert and oriented to person, place, and time.  Skin: Skin is warm and dry.  Psychiatric: She has a normal mood and affect. Judgment normal.  Nursing note and vitals reviewed.   ED Course  Procedures (including critical care time) Labs Review Labs Reviewed  WET PREP, GENITAL - Abnormal; Notable  for the following:    WBC, Wet Prep HPF POC FEW (*)    All other components within normal limits  URINALYSIS, ROUTINE W REFLEX MICROSCOPIC (NOT AT [facility]) - Abnormal; Notable for the following:    Color, Urine YELLOW (*)    Hgb urine dipstick TRACE (*)    Leukocytes, UA MODERATE (*)    All other components within normal limits  URINE MICROSCOPIC-ADD ON - Abnormal; Notable for the following:    Squamous Epithelial / LPF FEW (*)    Bacteria, UA FEW (*)    All other components within normal limits  URINE CULTURE  URINE CULTURE  POC URINE PREG, ED  GC/CHLAMYDIA PROBE AMP ([facility]) NOT AT [facility]    Imaging Review No results found. I  have personally reviewed and evaluated these images and lab results as part of my medical decision-making.   EKG Interpretation None      MDM   Final diagnoses:  Acute cystitis without hematuria    Acute cystitis.  Urine culture sent.  Home with Keflex.  Pelvic unremarkable.   Azalia Bilis, MD 02/04/15 [phone]

## 2015-02-04 NOTE — Discharge Instructions (Signed)

## 2015-02-04 NOTE — ED Notes (Signed)
Patient states she has been having urinary freq and pressure for 1 week. States pain is gettng worse in pelvic area. Denies vag. Discharge.

## 2015-02-05 LAB — URINE CULTURE

## 2015-03-08 ENCOUNTER — Encounter (HOSPITAL_COMMUNITY): Payer: Self-pay | Admitting: Emergency Medicine

## 2015-03-08 ENCOUNTER — Emergency Department (HOSPITAL_COMMUNITY)
Admission: EM | Admit: 2015-03-08 | Discharge: 2015-03-08 | Disposition: A | Discharge status: Home / Self Care | Payer: Medicaid Other | Attending: Emergency Medicine | Admitting: Emergency Medicine

## 2015-03-08 DIAGNOSIS — J020 Streptococcal pharyngitis: Secondary | ICD-10-CM | POA: Insufficient documentation

## 2015-03-08 DIAGNOSIS — Z862 Personal history of diseases of the blood and blood-forming organs and certain disorders involving the immune mechanism: Secondary | ICD-10-CM | POA: Diagnosis not present

## 2015-03-08 DIAGNOSIS — Z79899 Other long term (current) drug therapy: Secondary | ICD-10-CM | POA: Diagnosis not present

## 2015-03-08 DIAGNOSIS — Z792 Long term (current) use of antibiotics: Secondary | ICD-10-CM | POA: Insufficient documentation

## 2015-03-08 DIAGNOSIS — J029 Acute pharyngitis, unspecified: Secondary | ICD-10-CM | POA: Diagnosis present

## 2015-03-08 LAB — RAPID STREP SCREEN (MED CTR MEBANE ONLY): STREPTOCOCCUS, GROUP A SCREEN (DIRECT): POSITIVE — AB

## 2015-03-08 MED ORDER — PENICILLIN G BENZATHINE 1200000 UNIT/2ML IM SUSP
1.2000 10*6.[IU] | Freq: Once | INTRAMUSCULAR | Status: AC
  Administered 2015-03-08: 1.2 10*6.[IU] via INTRAMUSCULAR
  Filled 2015-03-08: qty 2

## 2015-03-08 MED ORDER — NAPROXEN 250 MG PO TABS
500.0000 mg | ORAL_TABLET | Freq: Once | ORAL | Status: AC
  Administered 2015-03-08: 500 mg via ORAL
  Filled 2015-03-08: qty 2

## 2015-03-08 MED ORDER — NAPROXEN 500 MG PO TABS: 500.0000 mg | ORAL_TABLET | Freq: Two times a day (BID) | ORAL | Status: DC

## 2015-03-08 NOTE — ED Provider Notes (Signed)
CSN: [id number]     Arrival date & time 03/08/15  0736 History   First MD Initiated Contact with Patient 03/08/15 [phone]     Chief Complaint  Patient presents with  . Sore Throat     (Consider location/radiation/quality/duration/timing/severity/associated sxs/prior Treatment) HPI Comments: Patient with history of sickle cell trait -- presents with complaint of acute onset sore throat starting yesterday, worsening early this morning. Patient is able to swallow but states it is painful. She has had some chills but no fever. No nausea or vomiting. She is concerned about strep throat. No cough, runny nose, ear pain. No treatments prior to arrival. Nothing makes symptoms better. Swallowing makes pain worse.  The history is provided by the patient.    Past Medical History  Diagnosis Date  . Vaginal Pap smear, abnormal 2004  . Medical history non-contributory   . Sickle cell trait [facility])    Past Surgical History  Procedure Laterality Date  . Cesarean section with bilateral tubal ligation Bilateral 08/21/2013    Procedure: CESAREAN SECTION ;  Surgeon: Lesly Dukes, MD;  Location: [facility];  Service: Obstetrics;  Laterality: Bilateral;  Repeat  ; No bilateral tubal ligation performed  . Cesarean section      x 2  . Keloid removal     Family History  Problem Relation Age of Onset  . Heart disease Mother   . Hypertension Mother   . Alcohol abuse Mother   . Stroke Mother   . Cancer Maternal Aunt 40    cancer   Social History  Substance Use Topics  . Smoking status: Never Smoker   . Smokeless tobacco: Never Used  . Alcohol Use: Yes     Comment: socially   OB History    Gravida Para Term Preterm AB TAB SAB Ectopic Multiple Living   Review of Systems  Constitutional: Positive for chills. Negative for fever and fatigue.  HENT: Positive for sore throat. Negative for congestion, ear pain, rhinorrhea and sinus pressure.   Eyes: Negative for redness.  Respiratory:  Negative for cough and wheezing.   Gastrointestinal: Negative for nausea, vomiting, abdominal pain and diarrhea.  Genitourinary: Negative for dysuria.  Musculoskeletal: Negative for myalgias and neck stiffness.  Skin: Negative for rash.  Neurological: Negative for headaches.  Hematological: Negative for adenopathy.      Allergies  Review of patient's allergies indicates no known allergies.  Home Medications   Prior to Admission medications   Medication Sig Start Date End Date Taking? Authorizing Provider  cephALEXin (KEFLEX) 500 MG capsule Take 1 capsule (500 mg total) by mouth 3 (three) times daily. 02/04/15   Azalia Bilis, MD  Multiple Vitamins-Minerals (MULTIVITAMIN WITH MINERALS) tablet Take 1 tablet by mouth daily.    Historical Provider, MD  valACYclovir (VALTREX) 500 MG tablet Take 1 tablet by mouth twice daily for 5 days when symptoms begin 04/27/14   Catalina Antigua, MD   BP 99/56 mmHg  Pulse 87  Temp(Src) 99.1 F (37.3 C) (Oral)  Resp 16  SpO2 100%  LMP 02/23/2015 (Exact Date) Physical Exam  Constitutional: She appears well-developed and well-nourished.  HENT:  Head: Normocephalic and atraumatic.  Right Ear: Tympanic membrane, external ear and ear canal normal.  Left Ear: Tympanic membrane, external ear and ear canal normal.  Nose: Nose normal. No mucosal edema or rhinorrhea.  Mouth/Throat: Uvula is midline and mucous membranes are normal. Mucous membranes are not  dry. No oral lesions. No trismus in the jaw. No uvula swelling. Posterior oropharyngeal edema and posterior oropharyngeal erythema present. No oropharyngeal exudate or tonsillar abscesses.  Eyes: Conjunctivae are normal. Right eye exhibits no discharge. Left eye exhibits no discharge.  Neck: Normal range of motion. Neck supple.  Cardiovascular: Normal rate, regular rhythm and normal heart sounds.   Pulmonary/Chest: Effort normal and breath sounds normal. No respiratory distress. She has no wheezes. She has no  rales.  Abdominal: Soft. There is no tenderness.  Lymphadenopathy:    She has cervical adenopathy.  Neurological: She is alert.  Skin: Skin is warm and dry.  Psychiatric: She has a normal mood and affect.  Nursing note and vitals reviewed.   ED Course  Procedures (including critical care time) Labs Review Labs Reviewed  RAPID STREP SCREEN (NOT AT [facility]) - Abnormal; Notable for the following:    Streptococcus, Group A Screen (Direct) POSITIVE (*)    All other components within normal limits    Imaging Review No results found. I have personally reviewed and evaluated these images and lab results as part of my medical decision-making.   EKG Interpretation None       8:25 AM Patient seen and examined. Patient informed of positive strep test. Medications ordered.   Vital signs reviewed and are as follows: BP 99/56 mmHg  Pulse 87  Temp(Src) 99.1 F (37.3 C) (Oral)  Resp 16  SpO2 100%  LMP 02/23/2015 (Exact Date)  Patient urged to return with worsening symptoms or other concerns. Patient verbalized understanding and agrees with plan.     MDM   Final diagnoses:  Strep throat   Patient with sore throat, positive strep test. Treated with Bicillin and NSAIDs. Patient is well-appearing. No signs of peritonsillar abscess. No suspicion for retropharyngeal abscess or epiglottitis.   Renne Crigler, PA-C 03/08/15 1713  Laurence Spates, MD 03/09/15 1250

## 2015-03-08 NOTE — ED Notes (Signed)
Pt from home with c/o sore throat since last night worsening today.  Pt reports she has not had fevers but feels like she is getting one.  Reports throat feeling swollen and post nasal drip.  Denies cough and congestion.  NAD, A&O.

## 2015-03-08 NOTE — Discharge Instructions (Signed)
Please read and follow all provided instructions.  Your diagnoses today include:  1. Strep throat    Tests performed today include:  Strep test: was POSITIVE for strep throat  Vital signs. See below for your results today.   Medications prescribed:  You were given a one-time shot of penicillin to treat your strep throat.    Naproxen - anti-inflammatory pain medication  Do not exceed  naproxen every 12 hours, take with food  You have been prescribed an anti-inflammatory medication or NSAID. Take with food. Take smallest effective dose for the shortest duration needed for your pain. Stop taking if you experience stomach pain or vomiting.    Take any medications prescribed only as directed.   Home care instructions:  Please read the educational materials provided and follow any instructions contained in this packet.  Follow-up instructions: Please follow-up with your primary care provider as needed for further evaluation of your symptoms.  Return instructions:   Please return to the Emergency Department if you experience worsening symptoms.   Return if you have worsening problems swallowing, your neck becomes swollen, you cannot swallow your saliva or your voice becomes muffled.   Return with high persistent fever, persistent vomiting, or if you have trouble breathing.   Please return if you have any other emergent concerns.  Additional Information:  Your vital signs today were: BP 99/56 mmHg   Pulse 87   Temp(Src) 99.1 F (37.3 C) (Oral)   Resp 16   SpO2 100%   LMP 02/23/2015 (Exact Date) If your blood pressure (BP) was elevated above 135/85 this visit, please have this repeated by your doctor within one month. --------------

## 2015-04-08 IMAGING — CR DG CHEST 2V
2 series · 2 of 2 positions shown · non-contrast
Comparison: None available

CLINICAL DATA: r/o pneumothorax r/o pneumothorax

EXAM:
CHEST - 2 VIEW

[w chest pa]
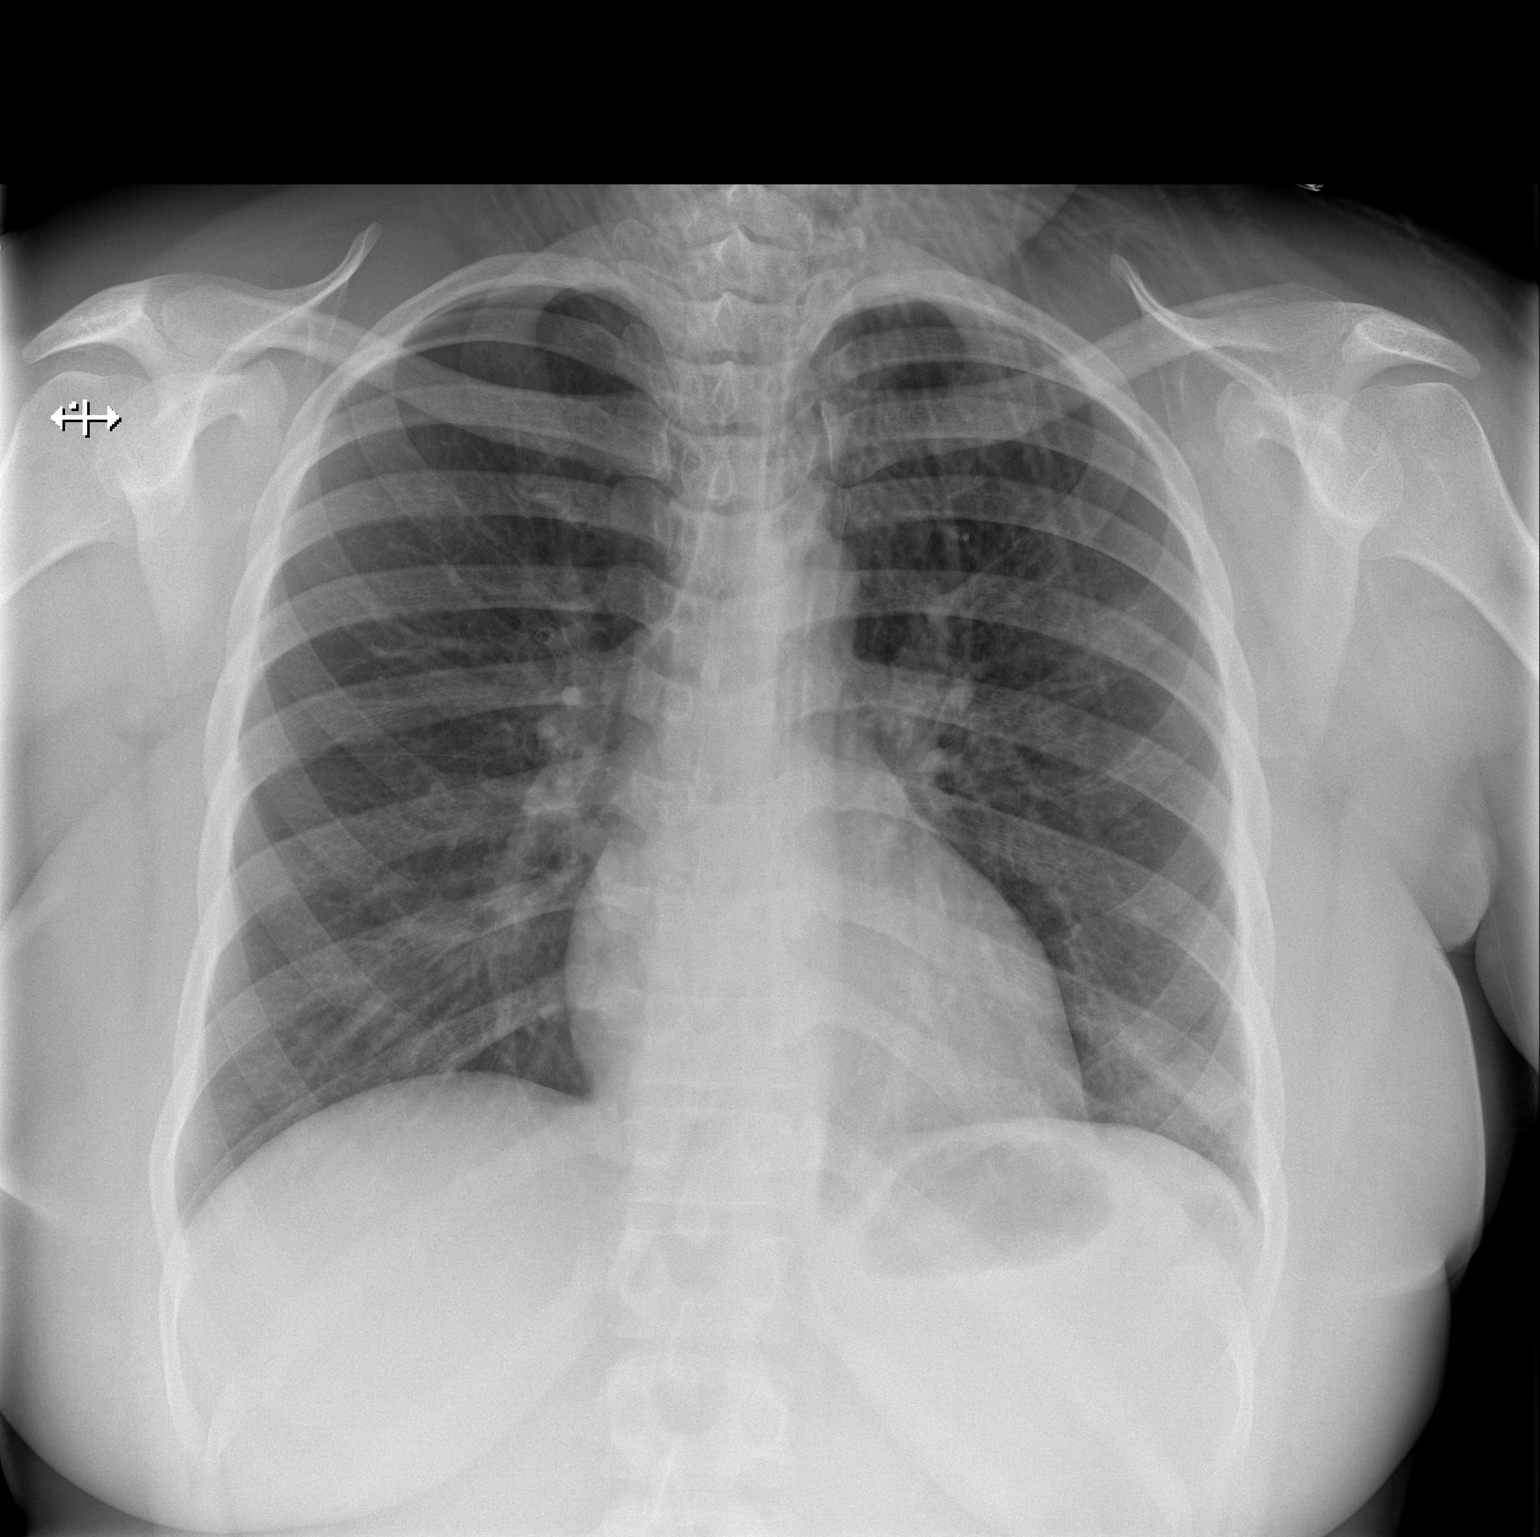

[w chest lat]
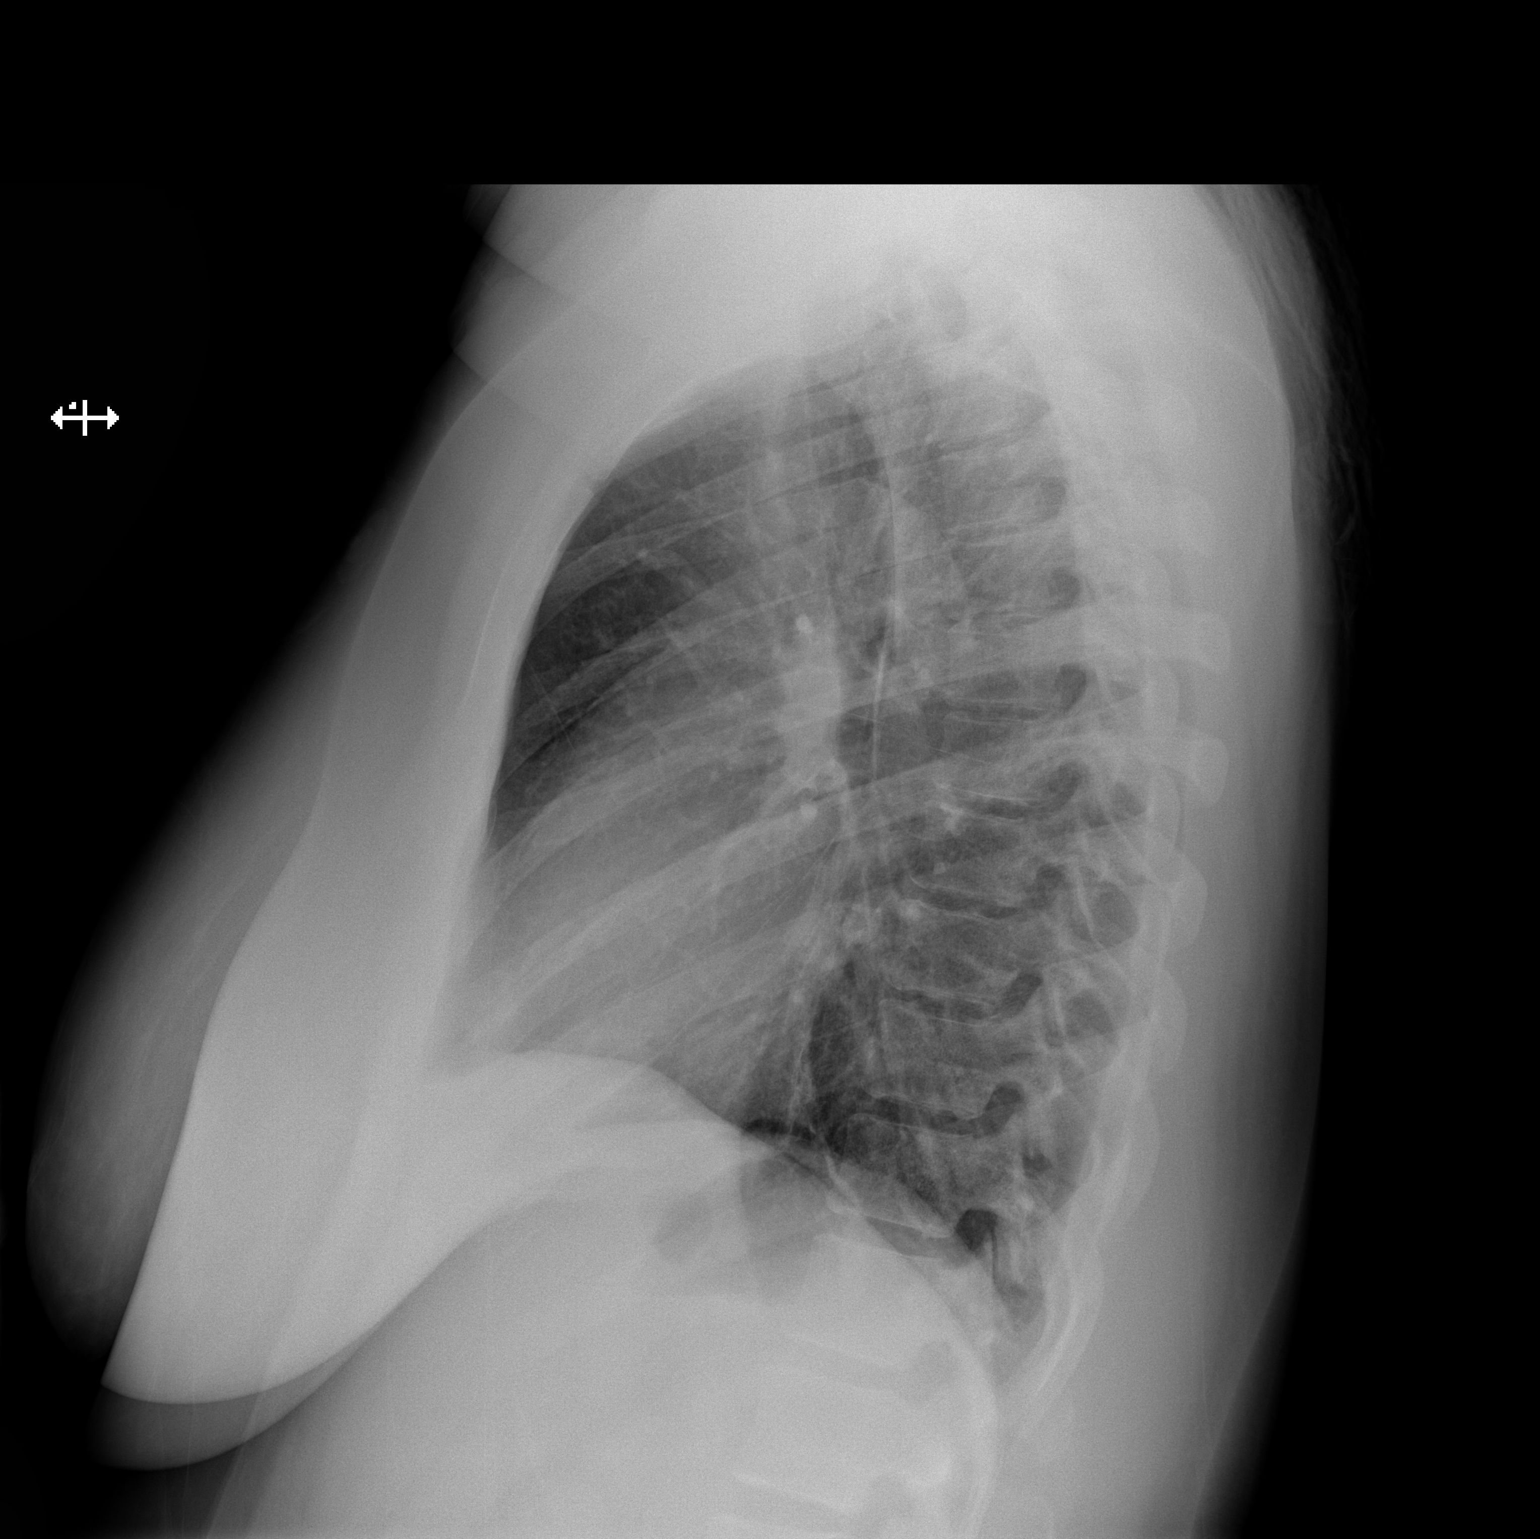

[2 of 2 positions shown; findings below may reference images not displayed]

FINDINGS: Lungs are clear. Heart size and mediastinal contours are within
normal limits.
No effusion.  No pneumothorax.
Visualized skeletal structures are unremarkable.
IMPRESSION: No acute cardiopulmonary disease.

## 2015-05-05 ENCOUNTER — Other Ambulatory Visit: Payer: Self-pay | Admitting: Obstetrics and Gynecology

## 2015-05-05 NOTE — Telephone Encounter (Signed)
Pt calling requesting refill on valacyclovir

## 2015-05-06 NOTE — Telephone Encounter (Signed)
To Md. Burnard HawthorneJazmin Dayna Geurts,CMA

## 2015-05-06 NOTE — Telephone Encounter (Signed)
Will forward to MD. Yolando Gillum,CMA  

## 2015-07-13 ENCOUNTER — Other Ambulatory Visit: Payer: Self-pay | Admitting: Internal Medicine

## 2015-07-13 NOTE — Telephone Encounter (Signed)
Pt needs refill on valACYclovir (VALTREX) 500 MG tablet  [facility] [place] [employer]

## 2015-07-14 MED ORDER — VALACYCLOVIR HCL 500 MG PO TABS: ORAL_TABLET | ORAL | Status: DC

## 2015-07-14 NOTE — Telephone Encounter (Signed)
Prescription refilled.

## 2015-08-18 ENCOUNTER — Encounter: Payer: Medicaid Other | Admitting: Internal Medicine

## 2015-09-12 ENCOUNTER — Other Ambulatory Visit: Payer: Self-pay | Admitting: Internal Medicine

## 2015-09-12 NOTE — Telephone Encounter (Signed)
Refill request for Valtrex. Pt will be seeing Gunadasa on 4/17 for cpe and pap, [facility] has no openings in April.

## 2015-09-13 MED ORDER — VALACYCLOVIR HCL 500 MG PO TABS: ORAL_TABLET | ORAL | Status: DC

## 2015-09-13 NOTE — Telephone Encounter (Signed)
Medication refilled

## 2015-09-19 ENCOUNTER — Encounter: Payer: Self-pay | Admitting: Internal Medicine

## 2015-09-19 ENCOUNTER — Ambulatory Visit (INDEPENDENT_AMBULATORY_CARE_PROVIDER_SITE_OTHER): Payer: Medicaid Other | Admitting: Internal Medicine

## 2015-09-19 VITALS — BP 121/77 | HR 76 | Temp 98.3°F | Wt 250.0 lb

## 2015-09-19 DIAGNOSIS — E669 Obesity, unspecified: Secondary | ICD-10-CM | POA: Diagnosis not present

## 2015-09-19 DIAGNOSIS — Z0000 Encounter for general adult medical examination without abnormal findings: Secondary | ICD-10-CM | POA: Diagnosis not present

## 2015-09-19 LAB — LIPID PANEL
CHOLESTEROL: 174 mg/dL (ref 125–200)
HDL: 50 mg/dL (ref >=46)
LDL Cholesterol: 112 mg/dL (ref <130)
TRIGLYCERIDES: 60 mg/dL (ref <150)
Total CHOL/HDL Ratio: 3.5 Ratio (ref <=5.0)
VLDL: 12 mg/dL (ref <30)

## 2015-09-19 LAB — POCT GLYCOSYLATED HEMOGLOBIN (HGB A1C): Hemoglobin A1C: 5.3

## 2015-09-19 NOTE — Assessment & Plan Note (Signed)
#   Health maintenance:  -STD screening: declined  -pap smear: last done 09/2014 with HPV and results were normal -lipid screening: done today due to obesity  -immunizations: up to date

## 2015-09-19 NOTE — Assessment & Plan Note (Addendum)
Per chart review 10lb weight loss in about 1 year. Currently is very motivated to improve lifestyle. She would like to keep her current goals (discussed in note) and try to be more consistent with her goals. She would also like to discuss her diet with Dr. Gerilyn PilgrimSykes. Patient reports she already has Dr. Gerilyn PilgrimSykes contact information to make an appointment. Prior referral expired.  - referral made to Dr. Gerilyn PilgrimSykes

## 2015-09-19 NOTE — Patient Instructions (Addendum)
Thank you for coming in!  I am so glad that you are motivated to change your lifestyle! Continue to do what you are doing. Please try to make an appointment with Dr. Gerilyn PilgrimSykes (nutrition).   Please follow up as needed You are getting lab work done today

## 2015-09-19 NOTE — Progress Notes (Signed)
Patient ID: Brenda Kelley, female   DOB: 1981-02-27, 35 y.o.   MRN: [id number] Date of Visit: 09/19/2015   HPI:  Patient presents today for a well woman exam. Would like to discuss her weight  Weight/Obesity  - walk 25 mins a day at the gym started 2 weeks ago; goes to gym 2-3 times a week - increased water intake daily - MVM daily - trying portion control - recently have been consistent with lifestyle goals  - has not set up an appointment with Dr. Gerilyn PilgrimSykes yet; had scheduling issues with other responsibilities but is motivated to make an appointment    Concerns today: none Periods: regular periods 5 days; no intermenstrual bleeding  Contraception: currently condoms; discussed IUD with Dr. 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 Need to make an appointment Optometrist: needs to make an appointment   ROS: See HPI. Additionally, no chest pain, SOB, abdominal pain, diarrhea/constipation.  PMFSH:  Cancers in family: Maternal Aunt  PHYSICAL EXAM: BP 121/77 mmHg  Pulse 76  Temp(Src) 98.3 F (36.8 C) (Oral)  Wt 250 lb (113.399 kg)  LMP 09/02/2015 Gen: NAD, pleasant, cooperative HEENT: NCAT, PERRL, no palpable thyromegaly or anterior cervical lymphadenopathy Heart: RRR, no murmurs Lungs: CTAB, NWOB Abdomen: soft, nontender to palpation, ND, + BS Neuro: grossly nonfocal, speech normal  ASSESSMENT/PLAN:  # Health maintenance:  -STD screening: declined  -pap smear: last done 09/2014 with HPV and results were normal -lipid screening: done today due to obesity  -immunizations: up to date - A1c today due  to obesity   Health care maintenance  # Health maintenance:  -STD screening: declined  -pap smear: last done 09/2014 with HPV and results were normal -lipid screening: done today due to obesity  -immunizations: up to date  Obesity  Per chart review 10lb weight loss in about 1 year. Currently is very motivated to improve lifestyle. She would like to keep her current goals (discussed in note) and try to be more consistent with her goals. She would also like to discuss her diet with Dr. Gerilyn PilgrimSykes. Patient reports she already has Dr. Gerilyn PilgrimSykes contact information to make an appointment. Prior referral expired.  - referral made to Dr. Gerilyn PilgrimSykes   FOLLOW UP: Follow up PRN and/or when she decides on contraception other than condoms  Palma HolterKanishka G Gunadasa, MD PGY 1 [facility]

## 2015-09-20 ENCOUNTER — Encounter: Payer: Self-pay | Admitting: Internal Medicine

## 2015-10-11 ENCOUNTER — Other Ambulatory Visit: Payer: Self-pay | Admitting: Internal Medicine

## 2015-10-11 MED ORDER — VALACYCLOVIR HCL 500 MG PO TABS: ORAL_TABLET | ORAL | Status: DC

## 2015-10-11 NOTE — Telephone Encounter (Signed)
Need refill for the Valacyclovir.  Also want to have provider contact her to discuss getting added refills on meds.

## 2015-10-11 NOTE — Telephone Encounter (Signed)
Medication refilled

## 2016-09-12 ENCOUNTER — Ambulatory Visit (INDEPENDENT_AMBULATORY_CARE_PROVIDER_SITE_OTHER): Payer: BLUE CROSS/BLUE SHIELD | Admitting: Internal Medicine

## 2016-09-12 ENCOUNTER — Other Ambulatory Visit (HOSPITAL_COMMUNITY)
Admission: RE | Admit: 2016-09-12 | Discharge: 2016-09-12 | Disposition: A | Discharge status: Home / Self Care | Payer: BLUE CROSS/BLUE SHIELD | Source: Ambulatory Visit | Attending: Family Medicine | Admitting: Family Medicine

## 2016-09-12 ENCOUNTER — Encounter: Payer: Self-pay | Admitting: Internal Medicine

## 2016-09-12 VITALS — BP 102/64 | HR 74 | Temp 98.4°F | Wt 253.0 lb

## 2016-09-12 DIAGNOSIS — R7309 Other abnormal glucose: Secondary | ICD-10-CM

## 2016-09-12 DIAGNOSIS — Z01419 Encounter for gynecological examination (general) (routine) without abnormal findings: Secondary | ICD-10-CM | POA: Diagnosis not present

## 2016-09-12 DIAGNOSIS — B009 Herpesviral infection, unspecified: Secondary | ICD-10-CM

## 2016-09-12 DIAGNOSIS — Z3009 Encounter for other general counseling and advice on contraception: Secondary | ICD-10-CM | POA: Diagnosis not present

## 2016-09-12 MED ORDER — VALACYCLOVIR HCL 500 MG PO TABS: ORAL_TABLET | ORAL | 2 refills | Status: DC

## 2016-09-12 NOTE — Patient Instructions (Addendum)
It was wonderful to meet you!  We have ordered some routine testing today. I will call you with these results.  We will need to order the Paraguard. Our office will call you when this has arrived and we will schedule an appointment at that point.  -Dr. Nancy Marus

## 2016-09-12 NOTE — Progress Notes (Signed)
36 y.o. year old female presents for well woman/preventative visit and annual GYN examination.  Acute Concerns:  -Patient would like to have a Paraguard.  -Would like A1c checked. Has a history of hyperglycemia. Last A1c was checked 4/17 and was 5.3%. 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 Having outbreaks every couple of months. Outbreaks clear with the Valacyclovir. No side effects. Not currently having an outbreak.  Diet: Watching her portions. Cutting back on carbs. Trying to increase water intake.  Exercise: Used to go to the gym, but lost motivation. Very active at work. Losing weight slowly.  Sexual/Birth History: sexually active with one partner  Birth Control: Using condoms  Social:  Social History   Social History  . Marital status: Legally Separated    Spouse name: N/A  . Number of children: N/A  . Years of education: N/A   Social History Main Topics  . Smoking status: Never Smoker  . Smokeless tobacco: Never Used  . Alcohol use Yes     Comment: socially  . Drug use: No  . Sexual activity: Yes    Birth control/ protection: Condom   Other Topics Concern  . None   Social History Narrative   As of 03/01/2014    Education up to 11th grade.   Unemployed , looking for job   No driver's license. Thinking about it but scared of other drivers.    Travels by bus or taxi   Married   Moved to [place] for cheaper living   Lives with husband (away much of the time truck driving) and 3 [place] children.   No exercise regularly.   No h/o tobacco use   No drug use   Social EtOH use.   Does not feel at risk for STD   Does feel safe in relationship.           Immunization: Immunization History  Administered Date(s) Administered  . Influenza Split 03/12/2013  . Influenza,inj,Quad PF,36+ Mos 05/13/2014  . Tdap 08/11/2013    Cancer Screening:  Pap Smear: Not due until 2019  Physical Exam: VITALS: Reviewed GEN: Pleasant female,  NAD HEENT: Normocephalic, PERRL, EOMI, no scleral icterus, nasal septum midline, MMM, uvula midline, no anterior or posterior lymphadenopathy, no thyromegaly CARDIAC:RRR, S1 and S2 present, no murmur, no heaves/thrills RESP: CTAB, normal effort ABD: soft, no tenderness, normal bowel sounds EXT: No edema, 2+ radial and DP pulses SKIN: no rash  ASSESSMENT & PLAN: 36 y.o. female presents for annual well woman/preventative exam and GYN exam. Please see problem specific assessment and plan.   Birth Control Counseling: Currently using condoms. Discussed different birth control options. Patient would like to use Paraguard. Discussed benefits and risks. - Will need to order paraguard and then call patient to schedule appointment. - STD testing performed today- HIV, RPR, gonorrhea, chlamydia.  Hyperglycemia: Has a history of hyperglycemia to 117 on a previous BMET. Last A1c 5.3%. - Attempted to check A1c today, but there was a lab error and it was not done - Will plan to check A1c when patient comes back for Paraguard insertion  HSV-2: Well-controlled - Refilled Valacyclovir  Willadean Carol, MD

## 2016-09-12 NOTE — Assessment & Plan Note (Addendum)
Currently using condoms. Discussed different birth control options. Patient would like to use Paraguard. Discussed benefits and risks. - Will need to order paraguard and then call patient to schedule appointment. - STD testing performed today- HIV, RPR, gonorrhea, chlamydia.

## 2016-09-12 NOTE — Assessment & Plan Note (Addendum)
Has a history of hyperglycemia to 117 on a previous BMET. Last A1c 5.3%. - Attempted to check A1c today, but there was a lab error and it was not done - Will plan to check A1c when patient comes back for Paraguard insertion

## 2016-09-12 NOTE — Assessment & Plan Note (Signed)
Well-controlled - Refilled Valacyclovir

## 2016-09-13 LAB — HIV ANTIBODY (ROUTINE TESTING W REFLEX): HIV Screen 4th Generation wRfx: NONREACTIVE

## 2016-09-13 LAB — RPR: RPR: NONREACTIVE

## 2016-09-13 LAB — URINE CYTOLOGY ANCILLARY ONLY
Chlamydia: NEGATIVE
Neisseria Gonorrhea: NEGATIVE
TRICH (WINDOWPATH): NEGATIVE

## 2016-09-17 LAB — URINE CYTOLOGY ANCILLARY ONLY
BACTERIAL VAGINITIS: POSITIVE — AB
Candida vaginitis: NEGATIVE

## 2016-10-09 ENCOUNTER — Telehealth: Payer: Self-pay | Admitting: *Deleted

## 2016-10-09 NOTE — Telephone Encounter (Signed)
-----   Message from Osborne OmanJessica D Fleeger, CMA sent at 09/25/2016  8:16 AM EDT ----- Renard HamperAsha I have asked robert to order this, can you be on the lookout for it while I am gone?  If it arrives just call and schedule her please :-)  ----- Message ----- From: Campbell StallKaty Dodd [facility], MD Sent: 09/12/2016  10:42 AM To: Princella PellegriniJessica D Fleeger, CMA  Hey, this patient would like to have a paraguard placed. Can we please order one and call her to schedule the paraguard insertion? Thank you so much!

## 2016-10-09 NOTE — Telephone Encounter (Signed)
Called patient to set up appointment, no answer, no voicemail.

## 2016-10-17 NOTE — Telephone Encounter (Signed)
Spoke with patient and she will come in on June 4th @ 10:45 for paragard placement. Fleeger, Maryjo RochesterJessica Dawn, CMA

## 2016-10-17 NOTE — Telephone Encounter (Signed)
Thank you so much Brenda Kelley!

## 2016-11-05 ENCOUNTER — Encounter: Payer: Self-pay | Admitting: Internal Medicine

## 2016-11-05 ENCOUNTER — Ambulatory Visit (INDEPENDENT_AMBULATORY_CARE_PROVIDER_SITE_OTHER): Payer: Self-pay | Admitting: Internal Medicine

## 2016-11-05 VITALS — BP 110/66 | HR 82 | Temp 98.1°F | Ht 67.0 in | Wt 254.0 lb

## 2016-11-05 DIAGNOSIS — Z30430 Encounter for insertion of intrauterine contraceptive device: Secondary | ICD-10-CM

## 2016-11-05 LAB — POCT URINE PREGNANCY: PREG TEST UR: NEGATIVE

## 2016-11-05 NOTE — Progress Notes (Signed)
Patient presented to clinic today for insertion of a paraguard IUD.  Patient gave a urine sample to check for pregnancy before being roomed for the provider.  She informed me while in the room that she didn't currently have insurance but was hoping we could bill her.  I advised her that I would check on this with our team lead and let her know before the doctor came in.  After speaking with both Jone BasemanJessica Fleeger, CMA and Purvis Sheffieldia Hill who handles offices charges I informed patient that we are unable to bill her but would need at least 55% (self pay discount) of the cost [phone]($[phone]) before we could insert the IUD.  Patient is unable to pay that and decided against seeing provider since this is what she came in for.  Patient was reimbursed her payment from today's visit.  Will ask provider to sign the note. Kirsti Mcalpine,CMA

## 2017-01-03 ENCOUNTER — Other Ambulatory Visit: Payer: Self-pay | Admitting: Internal Medicine

## 2017-01-03 MED ORDER — VALACYCLOVIR HCL 500 MG PO TABS: ORAL_TABLET | ORAL | 0 refills | Status: DC

## 2017-01-03 NOTE — Telephone Encounter (Signed)
Pt calling to request refill of:  Name of Medication(s):  valtrex Last date of OV:  11-05-16 Pharmacy:  [facility] [place]   Will route refill request to Clinic RN.  Discussed with patient policy to call pharmacy for future refills.  Also, discussed refills may take up to 48 hours to approve or deny.  Markus JarvisEmily C Pittman

## 2017-05-06 ENCOUNTER — Other Ambulatory Visit: Payer: Self-pay

## 2017-08-05 ENCOUNTER — Other Ambulatory Visit: Payer: Self-pay

## 2017-08-05 DIAGNOSIS — J029 Acute pharyngitis, unspecified: Secondary | ICD-10-CM | POA: Insufficient documentation

## 2017-08-05 DIAGNOSIS — Z79899 Other long term (current) drug therapy: Secondary | ICD-10-CM | POA: Insufficient documentation

## 2017-08-06 ENCOUNTER — Emergency Department (HOSPITAL_COMMUNITY)
Admission: EM | Admit: 2017-08-06 | Discharge: 2017-08-06 | Disposition: A | Discharge status: Home / Self Care | Payer: Self-pay | Attending: Emergency Medicine | Admitting: Emergency Medicine

## 2017-08-06 ENCOUNTER — Encounter (HOSPITAL_COMMUNITY): Payer: Self-pay | Admitting: Emergency Medicine

## 2017-08-06 ENCOUNTER — Other Ambulatory Visit: Payer: Self-pay

## 2017-08-06 DIAGNOSIS — J029 Acute pharyngitis, unspecified: Secondary | ICD-10-CM

## 2017-08-06 LAB — RAPID STREP SCREEN (MED CTR MEBANE ONLY): Streptococcus, Group A Screen (Direct): NEGATIVE

## 2017-08-06 MED ORDER — ACETAMINOPHEN 500 MG PO TABS: 500.0000 mg | ORAL_TABLET | Freq: Four times a day (QID) | ORAL | 0 refills | Status: DC | PRN

## 2017-08-06 NOTE — ED Provider Notes (Signed)
[facility] EMERGENCY DEPARTMENT Provider Note   CSN: [id number] Arrival date & time: 08/05/17  2343     History   Chief Complaint Chief Complaint  Patient presents with  . Sore Throat    HPI Brenda Kelley is a 37 y.o. female.  Patient presents with complaint of sore throat starting yesterday she has had associated cough.  No fevers, runny nose, or ear pain.  No difficulty swallowing.  No chest pain or shortness of breath.  No known sick contacts.  No treatments prior to arrival.  Onset of symptoms acute.  Course is constant.  Swelling makes the pain worse.  Nothing makes it better.      Past Medical History:  Diagnosis Date  . Medical history non-contributory   . Sickle cell trait ([place])   . Vaginal Pap smear, abnormal 2004    Patient Active Problem List   Diagnosis Date Noted  . Other abnormal glucose 09/12/2016  . Encounter for other general counseling or advice on contraception 09/15/2014  . Health care maintenance 05/13/2014  . Thyromegaly 03/02/2014  . Sickle cell trait ([place]) 03/02/2014  . Obesity 03/01/2014  . S/P C-section 08/21/2013  . 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 Previous cesarean delivery, delivered, with or without mention of antepartum condition 07/02/2013    Past Surgical History:  Procedure Laterality Date  . CESAREAN SECTION     x 2  . CESAREAN SECTION WITH BILATERAL TUBAL LIGATION Bilateral 08/21/2013   Procedure: CESAREAN SECTION ;  Surgeon: Guss Bunde, MD;  Location: [facility];  Service: Obstetrics;  Laterality: Bilateral;  Repeat  ; No bilateral tubal ligation performed  . Keloid removal      OB History    Gravida Para Term Preterm AB Living   _0 SAB TAB Ectopic Multiple Live Births     4     3       Home Medications    Prior to Admission medications   Medication Sig Start Date End Date Taking?  Authorizing Provider  acetaminophen (TYLENOL) 500 MG tablet Take 1 tablet (500 mg total) by mouth every 6 (six) hours as needed. 08/06/17   Carlisle Cater, PA-C  Multiple Vitamins-Minerals (MULTIVITAMIN WITH MINERALS) tablet Take 1 tablet by mouth daily.    [provider]  naproxen (NAPROSYN) 500 MG tablet Take 1 tablet (500 mg total) by mouth 2 (two) times daily. 03/08/15   Carlisle Cater, PA-C  valACYclovir (VALTREX) 500 MG tablet TAKE ONE TABLET BY MOUTH TWICE DAILY FOR 5 DAYS WHEN  SYMPTOMS  BEGIN 01/03/17   Smiley Houseman, MD    Family History Family History  Problem Relation Age of Onset  . Heart disease Mother   . Hypertension Mother   . Alcohol abuse Mother   . Stroke Mother   . Cancer Maternal Aunt 20       cancer    Social History Social History   Tobacco Use  . Smoking status: Never Smoker  . Smokeless tobacco: Never Used  Substance Use Topics  . Alcohol use: Yes    Comment: socially  . Drug use: No     Allergies   Patient has no known allergies.   Review of Systems Review of Systems  Constitutional: Negative for chills, fatigue and fever.  HENT: Positive for sore throat. Negative for congestion, ear pain, rhinorrhea and  sinus pressure.   Eyes: Negative for redness.  Respiratory: Positive for cough. Negative for wheezing.   Gastrointestinal: Negative for abdominal pain, diarrhea, nausea and vomiting.  Genitourinary: Negative for dysuria.  Musculoskeletal: Negative for myalgias and neck stiffness.  Skin: Negative for rash.  Neurological: Negative for headaches.  Hematological: Negative for adenopathy.     Physical Exam Updated Vital Signs BP 122/76   Pulse 79   Temp 98.2 F (36.8 C) (Oral)   Resp 18   Ht '5\' 8"'$  (1.727 m)   Wt 108.9 kg (240 lb)   LMP 07/25/2017 (Exact Date)   SpO2 100%   BMI 36.49 kg/m   Physical Exam  Constitutional: She appears well-developed and well-nourished.  HENT:  Head: Normocephalic and atraumatic.  Right  Ear: Tympanic membrane, external ear and ear canal normal.  Left Ear: Tympanic membrane, external ear and ear canal normal.  Nose: Nose normal. No mucosal edema or rhinorrhea.  Mouth/Throat: Uvula is midline and mucous membranes are normal. Mucous membranes are not dry. No oral lesions. No trismus in the jaw. No uvula swelling. Posterior oropharyngeal edema and posterior oropharyngeal erythema present. No oropharyngeal exudate or tonsillar abscesses.  Eyes: Conjunctivae are normal. Right eye exhibits no discharge. Left eye exhibits no discharge.  Neck: Normal range of motion. Neck supple.  Cardiovascular: Normal rate, regular rhythm and normal heart sounds.  Pulmonary/Chest: Effort normal and breath sounds normal. No respiratory distress. She has no wheezes. She has no rales.  Abdominal: Soft. There is no tenderness.  Lymphadenopathy:    She has no cervical adenopathy.  Neurological: She is alert.  Skin: Skin is warm and dry.  Psychiatric: She has a normal mood and affect.  Nursing note and vitals reviewed.    ED Treatments / Results  Labs (all labs ordered are listed, but only abnormal results are displayed) Labs Reviewed  RAPID STREP SCREEN (NOT AT [facility])  CULTURE, GROUP A STREP [facility])    EKG  EKG Interpretation None       Radiology No results found.  Procedures Procedures (including critical care time)  Medications Ordered in ED Medications - No data to display   Initial Impression / Assessment and Plan / ED Course  I have reviewed the triage vital signs and the nursing notes.  Pertinent labs & imaging results that were available during my care of the patient were reviewed by me and considered in my medical decision making (see chart for details).     Patient seen and examined.  Informed of negative x-ray results.  Vital signs reviewed and are as follows: BP 122/76   Pulse 79   Temp 98.2 F (36.8 C) (Oral)   Resp 18   Ht '5\' 8"'$  (1.727 m)   Wt 108.9 kg (240  lb)   LMP 07/25/2017 (Exact Date)   SpO2 100%   BMI 36.49 kg/m   Patient counseled on supportive care for viral pharyngitis and s/s to return including worsening symptoms, persistent fever, persistent vomiting, or if they have any other concerns. Urged to see PCP if symptoms persist for more than 3 days. Patient verbalizes understanding and agrees with plan.    Final Clinical Impressions(s) / ED Diagnoses   Final diagnoses:  Viral pharyngitis   Zero of four CENTOR criteria met (fever, cervical adenopathy, tonsillar/pharyngeal exudate, no cough).  Strep screen negative.  Antibiotics not indicated.      ED Discharge Orders        Ordered    acetaminophen (TYLENOL) 500 MG  tablet  Every 6 hours PRN     08/06/17 0253       Carlisle Cater, PA-C 08/06/17 0316    Veryl Speak, MD 08/06/17 [phone]

## 2017-08-06 NOTE — Discharge Instructions (Signed)
Please read and follow all provided instructions.  Your diagnoses today include:  1. Viral pharyngitis     Tests performed today include:  Strep test: was negative for strep throat  Strep culture: you will be notified if this comes back positive  Vital signs. See below for your results today.   Medications prescribed:   Tylenol - medication for pain  Home care instructions:  Please read the educational materials provided and follow any instructions contained in this packet.  Follow-up instructions: Please follow-up with your primary care provider as needed for further evaluation of your symptoms.  Return instructions:   Please return to the Emergency Department if you experience worsening symptoms.   Return if you have worsening problems swallowing, your neck becomes swollen, you cannot swallow your saliva or your voice becomes muffled.   Return with high persistent fever, persistent vomiting, or if you have trouble breathing.   Please return if you have any other emergent concerns.  Additional Information:  Your vital signs today were: BP 122/76    Pulse 79    Temp 98.2 F (36.8 C) (Oral)    Resp 18    Ht 5\' 8"  (1.727 m)    Wt 108.9 kg (240 lb)    LMP 07/25/2017 (Exact Date)    SpO2 100%    BMI 36.49 kg/m  If your blood pressure (BP) was elevated above 135/85 this visit, please have this repeated by your doctor within one month. --------------

## 2017-08-06 NOTE — ED Triage Notes (Signed)
Pt c/o sore throat onset yesterday.  Worse toiday

## 2017-08-08 LAB — CULTURE, GROUP A STREP (THRC)

## 2017-09-18 ENCOUNTER — Other Ambulatory Visit: Payer: Self-pay

## 2017-09-18 ENCOUNTER — Other Ambulatory Visit (HOSPITAL_COMMUNITY)
Admission: RE | Admit: 2017-09-18 | Discharge: 2017-09-18 | Disposition: A | Discharge status: Home / Self Care | Payer: Medicaid Other | Source: Ambulatory Visit | Attending: Family Medicine | Admitting: Family Medicine

## 2017-09-18 ENCOUNTER — Ambulatory Visit (INDEPENDENT_AMBULATORY_CARE_PROVIDER_SITE_OTHER): Payer: Medicaid Other | Admitting: Internal Medicine

## 2017-09-18 ENCOUNTER — Encounter: Payer: Self-pay | Admitting: Internal Medicine

## 2017-09-18 VITALS — BP 100/70 | HR 83 | Temp 98.4°F | Wt 255.0 lb

## 2017-09-18 DIAGNOSIS — Z124 Encounter for screening for malignant neoplasm of cervix: Secondary | ICD-10-CM | POA: Diagnosis not present

## 2017-09-18 DIAGNOSIS — Z3009 Encounter for other general counseling and advice on contraception: Secondary | ICD-10-CM | POA: Diagnosis not present

## 2017-09-18 DIAGNOSIS — Z0000 Encounter for general adult medical examination without abnormal findings: Secondary | ICD-10-CM

## 2017-09-18 DIAGNOSIS — E669 Obesity, unspecified: Secondary | ICD-10-CM

## 2017-09-18 DIAGNOSIS — Z6837 Body mass index (BMI) 37.0-37.9, adult: Secondary | ICD-10-CM | POA: Diagnosis not present

## 2017-09-18 DIAGNOSIS — Z01419 Encounter for gynecological examination (general) (routine) without abnormal findings: Secondary | ICD-10-CM

## 2017-09-18 DIAGNOSIS — Z113 Encounter for screening for infections with a predominantly sexual mode of transmission: Secondary | ICD-10-CM

## 2017-09-18 LAB — POCT GLYCOSYLATED HEMOGLOBIN (HGB A1C): Hemoglobin A1C: 5.4

## 2017-09-18 NOTE — Progress Notes (Signed)
37 y.o. year old female presents for well woman/preventative visit and annual GYN examination.  Acute Concerns: wants to discuss birth control. Interested in the paraguard. Wants to be on birth control that is non-hormonal and wants to have regular periods. Would prefer paraguard over the mirena.  Diet: Has been trying to diet by doing calorie counting. Currently trying to eat 1500 calories per day. Also drinking more water. Has cut out unhealthy foods like burgers and fries and is trying to eat more fruits and vegetables  Exercise: Has not been exercising recently, but would like to get back into this. States she thinks she wants to start going to the gym at her apartment complex.  Sexual/Birth History: Sexually active with her husband  Social:  Social History   Socioeconomic History  . Marital status: Legally Separated    Spouse name: Not on file  . Number of children: Not on file  . Years of education: Not on file  . Highest education level: Not on file  Occupational History  . Not on file  Social Needs  . Financial resource strain: Not on file  . Food insecurity:    Worry: Not on file    Inability: Not on file  . Transportation needs:    Medical: Not on file    Non-medical: Not on file  Tobacco Use  . Smoking status: Never Smoker  . Smokeless tobacco: Never Used  Substance and Sexual Activity  . Alcohol use: Yes    Comment: socially  . Drug use: No  . Sexual activity: Yes    Birth control/protection: Condom  Lifestyle  . Physical activity:    Days per week: Not on file    Minutes per session: Not on file  . Stress: Not on file  Relationships  . Social connections:    Talks on phone: Not on file    Gets together: Not on file    Attends religious service: Not on file    Active member of club or organization: Not on file    Attends meetings of clubs or organizations: Not on file    Relationship status: Not on file  Other Topics Concern  . Not on file  Social  History Narrative   As of 03/01/2014    Education up to 11th grade.   Unemployed , looking for job   No driver's license. Thinking about it but scared of other drivers.    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   No exercise regularly.   No h/o tobacco use   No drug use   Social EtOH use.   Does not feel at risk for STD   Does feel safe in relationship.           Immunization: Immunization History  Administered Date(s) Administered  . Influenza Split 03/12/2013  . Influenza,inj,Quad PF,6+ Mos 05/13/2014  . Tdap 08/11/2013    Cancer Screening:  Pap Smear: Performed today  Physical Exam: VITALS: Reviewed GEN: Pleasant female, NAD HEENT: Normocephalic, PERRL, EOMI, no scleral icterus, MMM, uvula midline, no anterior or posterior lymphadenopathy, no thyromegaly CARDIAC:RRR, S1 and S2 present, no murmur, no heaves/thrills RESP: CTAB, normal effort ABD: soft, no tenderness, normal bowel sounds GU/GYN:Exam performed in the presence of a chaperone. Normal external genitalia. Small amount of blood present in the vagina. Cervix unremarkable. EXT: No edema,  2+ radial and DP pulses SKIN: no rash  ASSESSMENT & PLAN: 37 y.o. female presents for annual well woman/preventative exam and GYN exam. Please see problem specific assessment and plan.   Birth Control Counseling: Discussed different birth control options. Patient would like to proceed with the paraguard. - Patient scheduled in colpo clinic to have paraguard placed  Screening for Cervical Cancer: - Pap performed today  Screening for STDs: - Gonorrhea, chlamydia, HIV, and RPR ordered  Obesity: - Check lipid panel and A1c  Willadean Carol, MD PGY-3

## 2017-09-18 NOTE — Patient Instructions (Signed)
It was so nice to see you today!  You are doing a great job with your diet!   We discussed working on your exercise today. The recommended amount of exercise for adults is 30 minutes 5 days per week.  I will call you with your lab results.  -Dr. Nancy MarusMayo

## 2017-09-19 DIAGNOSIS — Z113 Encounter for screening for infections with a predominantly sexual mode of transmission: Secondary | ICD-10-CM | POA: Insufficient documentation

## 2017-09-19 DIAGNOSIS — Z124 Encounter for screening for malignant neoplasm of cervix: Secondary | ICD-10-CM | POA: Insufficient documentation

## 2017-09-19 LAB — LIPID PANEL
CHOL/HDL RATIO: 3.5 ratio (ref 0.0–4.4)
Cholesterol, Total: 168 mg/dL (ref 100–199)
HDL: 48 mg/dL (ref >39)
LDL Calculated: 107 mg/dL — ABNORMAL HIGH (ref 0–99)
Triglycerides: 66 mg/dL (ref 0–149)
VLDL CHOLESTEROL CAL: 13 mg/dL (ref 5–40)

## 2017-09-19 LAB — HIV ANTIBODY (ROUTINE TESTING W REFLEX): HIV Screen 4th Generation wRfx: NONREACTIVE

## 2017-09-19 LAB — RPR: RPR Ser Ql: NONREACTIVE

## 2017-09-19 NOTE — Assessment & Plan Note (Signed)
-   Gonorrhea, chlamydia, HIV, and RPR ordered 

## 2017-09-19 NOTE — Assessment & Plan Note (Signed)
Discussed different birth control options. Patient would like to proceed with the paraguard. - Patient scheduled in colpo clinic to have paraguard placed

## 2017-09-19 NOTE — Assessment & Plan Note (Signed)
Pap performed today. 

## 2017-09-19 NOTE — Assessment & Plan Note (Signed)
-   Check lipid panel and A1c

## 2017-09-20 LAB — CYTOLOGY - PAP
Chlamydia: NEGATIVE
DIAGNOSIS: NEGATIVE
HPV (WINDOPATH): NOT DETECTED
Neisseria Gonorrhea: NEGATIVE
Trichomonas: NEGATIVE

## 2017-09-24 ENCOUNTER — Telehealth: Payer: Self-pay | Admitting: Internal Medicine

## 2017-09-24 NOTE — Telephone Encounter (Signed)
Called patient to discuss her lab results. All labs were negative except for LDL that was mildly elevated to 107. Recommended that patient eat a low fat diet and get 30 minutes of exercise per week. Patient voiced understanding. All questions answered.  Brenda CarolKaty Mayo, MD PGY-3

## 2017-10-03 ENCOUNTER — Ambulatory Visit: Payer: Medicaid Other

## 2017-10-14 ENCOUNTER — Ambulatory Visit (INDEPENDENT_AMBULATORY_CARE_PROVIDER_SITE_OTHER): Payer: Medicaid Other | Admitting: Internal Medicine

## 2017-10-14 ENCOUNTER — Encounter: Payer: Self-pay | Admitting: Internal Medicine

## 2017-10-14 ENCOUNTER — Other Ambulatory Visit: Payer: Self-pay

## 2017-10-14 VITALS — BP 118/74 | HR 72 | Temp 98.6°F | Ht 67.0 in | Wt 259.0 lb

## 2017-10-14 DIAGNOSIS — L910 Hypertrophic scar: Secondary | ICD-10-CM | POA: Diagnosis not present

## 2017-10-14 HISTORY — DX: Hypertrophic scar: L91.0

## 2017-10-14 NOTE — Assessment & Plan Note (Signed)
Present on the ears bilaterally. Has had them before and they were removed 10 years ago. - Referral to ENT

## 2017-10-14 NOTE — Progress Notes (Signed)
   [facility] Phone: [phone]  Subjective:  Brenda Kelley is a 37 year old female presenting to clinic with keloids of both ears. They have been slowly worsening over the last 2-3 years. She has had these before and had them removed 10 years ago in [place]. They are very bothersome to her. It hurts to lay on her side at night. The pain is "throbbing". She has been using Ibuprofen for pain. No ear drainage, no hearing loss.  ROS: See HPI for pertinent positives and negatives  Past Medical History- HSV-2, sickle cell trait  Family history reviewed for today's visit. No changes.  Social history- patient is a never smoker  Objective: BP 118/74   Pulse 72   Temp 98.6 F (37 C) (Oral)   Ht  (1.702 m)   Wt 259 lb (117.5 kg)   LMP 09/08/2017 (Exact Date)   SpO2 97%   BMI 40.57 kg/m  Gen: NAD, alert, cooperative with exam HEENT: NCAT, EOMI, MMM, very large keloids present on the ears bilaterally      Assessment/Plan: Keloids: Present on the ears bilaterally. Has had them before and they were removed 10 years ago. - Referral to ENT   Willadean Carol, MD PGY-3

## 2017-10-14 NOTE — Patient Instructions (Signed)
It was so nice to see you today!  I have sent in a referral to the ENT doctor. You should hear from our office in the next couple of weeks to schedule this appointment.  -Dr. Nancy Marus

## 2018-01-07 ENCOUNTER — Telehealth: Payer: Self-pay | Admitting: *Deleted

## 2018-01-07 NOTE — Telephone Encounter (Signed)
LMOVM for pt to return call.   Is she still interested in paraguard IUD for contraception?  I have one that we ordered for her in May 2019.  She may schedule in colpo clinic if still interested. [place], Maryjo RochesterJessica Dawn, CMA

## 2018-01-16 NOTE — Telephone Encounter (Signed)
Thanks Hannah!

## 2018-03-12 ENCOUNTER — Other Ambulatory Visit: Payer: Self-pay | Admitting: Internal Medicine

## 2018-04-29 ENCOUNTER — Other Ambulatory Visit: Payer: Self-pay | Admitting: Family Medicine

## 2018-04-29 MED ORDER — VALACYCLOVIR HCL 500 MG PO TABS: ORAL_TABLET | ORAL | 0 refills | Status: DC

## 2018-07-15 ENCOUNTER — Encounter (HOSPITAL_COMMUNITY): Payer: Self-pay | Admitting: *Deleted

## 2018-07-15 ENCOUNTER — Emergency Department (HOSPITAL_COMMUNITY)
Admission: EM | Admit: 2018-07-15 | Discharge: 2018-07-15 | Disposition: A | Discharge status: Home / Self Care | Payer: BLUE CROSS/BLUE SHIELD | Attending: Emergency Medicine | Admitting: Emergency Medicine

## 2018-07-15 DIAGNOSIS — J069 Acute upper respiratory infection, unspecified: Secondary | ICD-10-CM | POA: Diagnosis not present

## 2018-07-15 DIAGNOSIS — R05 Cough: Secondary | ICD-10-CM | POA: Diagnosis not present

## 2018-07-15 DIAGNOSIS — J040 Acute laryngitis: Secondary | ICD-10-CM

## 2018-07-15 DIAGNOSIS — Z79899 Other long term (current) drug therapy: Secondary | ICD-10-CM | POA: Insufficient documentation

## 2018-07-15 DIAGNOSIS — B9789 Other viral agents as the cause of diseases classified elsewhere: Secondary | ICD-10-CM

## 2018-07-15 MED ORDER — BENZONATATE 100 MG PO CAPS: 100.0000 mg | ORAL_CAPSULE | Freq: Three times a day (TID) | ORAL | 0 refills | Status: DC | PRN

## 2018-07-15 NOTE — Discharge Instructions (Signed)
Please read instructions below.  Rest her voice as much as possible.  You can use humidified air and warm tea with honey. Drink plenty of water.  Use saline nasal spray for congestion. You can take Tessalon every 8 hours as needed for cough. Follow up with your primary care provider if symptoms persist. Return to the ER for inability to swallow liquids, difficulty breathing, or new or worsening symptoms.

## 2018-07-15 NOTE — ED Triage Notes (Signed)
Pt in c/o hoarse voice onset x 1 wk, pt denies fever and chills, c/o dry cough with help with Mucinex with clear sputum, pt A&O x4

## 2018-07-15 NOTE — ED Provider Notes (Signed)
[facility] EMERGENCY DEPARTMENT Provider Note   CSN: [id number] Arrival date & time: 07/15/18  8889     History   Chief Complaint Chief Complaint  Patient presents with  . URI    HPI Brenda Kelley is a 38 y.o. female presenting to the emergency department with complaint of 1 week of hoarse voice and dry cough.  Patient states symptoms initially began with 1 day of sore throat which resolved, and then she began have hoarse voice the second day.  She has associated dry cough, runny and congested nose.  Has been treating her symptoms with Mucinex.  No difficulty breathing or swallowing, fevers, chills, or other complaints.  The history is provided by the patient.    Past Medical History:  Diagnosis Date  . Medical history non-contributory   . Sickle cell trait ([facility])   . Vaginal Pap smear, abnormal 2004    Patient Active Problem List   Diagnosis Date Noted  . Keloid 10/14/2017  . Encounter for other general counseling or advice on contraception 09/15/2014  . Health care maintenance 05/13/2014  . Sickle cell trait ([facility]) 03/02/2014  . Obesity 03/01/2014  . 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 CESAREAN SECTION     x 2  . CESAREAN SECTION WITH BILATERAL TUBAL LIGATION Bilateral 08/21/2013   Procedure: CESAREAN SECTION ;  Surgeon: Lesly Dukes, MD;  Location: [facility];  Service: Obstetrics;  Laterality: Bilateral;  Repeat  ; No bilateral tubal ligation performed  . Keloid removal       OB History    Gravida  7   Para  3   Term  3   Preterm      AB  4   Living  3     SAB      TAB  4   Ectopic      Multiple      Live Births  3            Home Medications    Prior to Admission medications   Medication Sig Start Date End Date Taking? Authorizing Provider  acetaminophen (TYLENOL) 500 MG tablet Take 1 tablet (500 mg total) by mouth every 6 (six) hours as  needed. 08/06/17   Renne Crigler, PA-C  benzonatate (TESSALON) 100 MG capsule Take 1 capsule (100 mg total) by mouth 3 (three) times daily as needed for cough. 07/15/18   Robinson, [place] N, PA-C  Multiple Vitamins-Minerals (MULTIVITAMIN WITH MINERALS) tablet Take 1 tablet by mouth daily.    [provider]  naproxen (NAPROSYN) 500 MG tablet Take 1 tablet (500 mg total) by mouth 2 (two) times daily. 03/08/15   Renne Crigler, PA-C  valACYclovir (VALTREX) 500 MG tablet TAKE 1 TABLET BY MOUTH TWICE DAILY FOR 5 DAYS WHEN SYMPTOMS BEGIN 04/29/18   [name], DO    Family History Family History  Problem Relation Age of Onset  . Heart disease Mother   . Hypertension Mother   . Alcohol abuse Mother   . Stroke Mother   . Cancer Maternal Aunt 21       cancer    Social History Social History   Tobacco Use  . Smoking status: Never Smoker  . Smokeless tobacco: Never Used  Substance Use Topics  . Alcohol use: Yes    Comment: socially  . Drug use: No     Allergies   Patient has  no known allergies.   Review of Systems Review of Systems  HENT: Positive for sore throat and voice change.   Respiratory: Positive for cough. Negative for shortness of breath.      Physical Exam Updated Vital Signs BP 118/78 (BP Location: Right Arm)   Pulse 80   Temp 98.7 F (37.1 C) (Oral)   Resp 18   Ht 5\' 8"  (1.727 m)   Wt 116.1 kg   LMP 06/29/2018   SpO2 97%   BMI 38.92 kg/m   Physical Exam Vitals signs and nursing note reviewed.  Constitutional:      Appearance: She is well-developed. She is obese. She is not ill-appearing.  HENT:     Head: Normocephalic and atraumatic.     Right Ear: Tympanic membrane and ear canal normal.     Left Ear: Tympanic membrane and ear canal normal.     Mouth/Throat:     Mouth: Mucous membranes are moist.     Pharynx: Oropharynx is clear.     Comments: Tolerating secretions.  Uvula midline, no trismus. Eyes:     Conjunctiva/sclera:  Conjunctivae normal.  Neck:     Musculoskeletal: Normal range of motion and neck supple. No neck rigidity.  Cardiovascular:     Rate and Rhythm: Normal rate and regular rhythm.  Pulmonary:     Effort: Pulmonary effort is normal.     Breath sounds: Normal breath sounds.  Lymphadenopathy:     Cervical: No cervical adenopathy.  Neurological:     Mental Status: She is alert.  Psychiatric:        Mood and Affect: Mood normal.        Behavior: Behavior normal.      ED Treatments / Results  Labs (all labs ordered are listed, but only abnormal results are displayed) Labs Reviewed - No data to display  EKG None  Radiology No results found.  Procedures Procedures (including critical care time)  Medications Ordered in ED Medications - No data to display   Initial Impression / Assessment and Plan / ED Course  I have reviewed the triage vital signs and the nursing notes.  Pertinent labs & imaging results that were available during my care of the patient were reviewed by me and considered in my medical decision making (see chart for details).    Patients symptoms are consistent with URI and laryngitis, likely viral etiology. Afebrile, tolerating secretions.  Lungs clear to auscultation bilaterally. Discussed that antibiotics are not indicated for viral infections. Pt will be discharged with symptomatic treatment.  Recommend humidified air, voice rest, over-the-counter medications.  Will prescribe Tessalon for cough.  Verbalizes understanding and is agreeable with plan. Pt is hemodynamically stable & in NAD prior to dc.  Discussed results, findings, treatment and follow up. Patient advised of return precautions. Patient verbalized understanding and agreed with plan.  Final Clinical Impressions(s) / ED Diagnoses   Final diagnoses:  Viral URI with cough  Laryngitis    ED Discharge Orders         Ordered    benzonatate (TESSALON) 100 MG capsule  3 times daily PRN     07/15/18  1022           Robinson, [place]Jordan N, [place] JerseyPA-C 07/15/18 1022    Rolan BuccoBelfi, Melanie, MD 07/15/18 1224

## 2018-10-08 ENCOUNTER — Other Ambulatory Visit: Payer: Self-pay | Admitting: Family Medicine

## 2018-10-09 MED ORDER — VALACYCLOVIR HCL 500 MG PO TABS: ORAL_TABLET | ORAL | 0 refills | Status: DC

## 2019-01-24 ENCOUNTER — Encounter: Payer: Self-pay | Admitting: Family Medicine

## 2019-01-26 NOTE — Telephone Encounter (Signed)
Hello Ms. Brenda Kelley, is this a short-term disability issue vs FMLA? The patient herself is not disabled, but needs time off to take care of her daughter. What's the best way I should precede? Thank you for your help, Jarrett Soho

## 2019-02-11 ENCOUNTER — Encounter: Payer: BLUE CROSS/BLUE SHIELD | Admitting: Family Medicine

## 2019-02-19 ENCOUNTER — Encounter: Payer: Self-pay | Admitting: Family Medicine

## 2019-03-04 ENCOUNTER — Encounter: Payer: BC Managed Care – PPO | Admitting: Family Medicine

## 2019-03-25 ENCOUNTER — Other Ambulatory Visit: Payer: Self-pay

## 2019-03-25 ENCOUNTER — Ambulatory Visit (INDEPENDENT_AMBULATORY_CARE_PROVIDER_SITE_OTHER): Payer: BC Managed Care – PPO | Admitting: Family Medicine

## 2019-03-25 ENCOUNTER — Encounter: Payer: Self-pay | Admitting: Family Medicine

## 2019-03-25 VITALS — BP 124/74 | HR 87 | Ht 68.5 in | Wt 269.0 lb

## 2019-03-25 DIAGNOSIS — Z6841 Body Mass Index (BMI) 40.0 and over, adult: Secondary | ICD-10-CM

## 2019-03-25 DIAGNOSIS — F419 Anxiety disorder, unspecified: Secondary | ICD-10-CM

## 2019-03-25 DIAGNOSIS — Z0000 Encounter for general adult medical examination without abnormal findings: Secondary | ICD-10-CM

## 2019-03-25 LAB — POCT GLYCOSYLATED HEMOGLOBIN (HGB A1C): Hemoglobin A1C: 5.5 % (ref 4.0–5.6)

## 2019-03-25 NOTE — Patient Instructions (Signed)
It was great seeing you today!   I'd like to see you back 1 year  but if you need to be seen earlier than that for any new issues we're happy to fit you in, just give [place] a call!   If you have questions or concerns please do not hesitate to call at [phone].  Dr. Rushie [facility]

## 2019-03-25 NOTE — Assessment & Plan Note (Addendum)
Patient had normal Pap smear last year and normal Pap with negative HPV testing in 2016.  Next Pap smear due in 2022.  Discussed the Pap smear schedule with patient. Patient denies need for STI testing at this time.  Patient declined to have influenza vaccination today.

## 2019-03-25 NOTE — Assessment & Plan Note (Addendum)
Discussed 150 minutes weekly exercise recommendation and ways to increase vegetable intake and decrease intake caloric intake. The patient was asked to make an attempt to improve diet and exercise patterns to aid in medical management of this problem.  Obtain lipid panel and BMP. A1c 5.5 today.

## 2019-03-25 NOTE — Assessment & Plan Note (Signed)
Patient refused to take GAD-7 and PHQ-9.  Patient states she would not take medications and does not want to take the questionnaires.  Advised patient to schedule a visit with her daughter's pediatrician to reevaluate terms of FMLA paperwork.  Educated patient regarding mental versus physical disability requirements relating to short-term disability.  Patient states that she is aware that she is not mentally or physically disabled.  Asked patient to go and speak with her HR department regarding a potential leave of absence.

## 2019-03-25 NOTE — Progress Notes (Signed)
Subjective:  Brenda Kelley is a 38 y.o. female who presents to the [facility] today with a chief complaint of annual exam.    HPI:  Brenda Kelley here for physical.   Obesity Patient reports unintentional weight gain.  States that she has been trying to work on her dietary habits and wants to lose weight.  States that she drinks water however enjoys apple juice and green tea throughout the day.  She has been working on decreasing her portion sizes.  She tries not to snack throughout the day.  She works out on the treadmill however has not been able to lose significant weight.  Denies pain, fatigue, focal weakness and sleep disturbance.   Anxiety  Patient states that she has not been able to focus well at her job.  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  Patient reports that she is often in the hospital with her daughter once or twice a month due to her daughter's illness.  Her husband has also been dealing with anxiety.  Reports that she overwhelmed.  Her job is refusing to allow her to take time off when her daughter is not in the hospital.  She has FMLA for her daughter.  Patient requesting short-term disability to allow her time away from her job to do with her daughter's illness.  Healthcare maintenance Patient here for physical today.  Also requesting Pap smear as she has had a abnormal Pap in the past.  Denies vaginal discharge, pelvic pain, nausea or vomiting, concerns for STIs, dysuria, dyspareunia, vaginal itching, vaginal bleeding and abdominal pain.  PMFSH, medications and smoking status reviewed.   ROS: See HPI     Objective:  Physical Exam: BP 124/74   Pulse 87   Ht 5' 8.5" (1.74 m)   Wt 269 lb (122 kg)   LMP 03/14/2019 (Exact Date)   SpO2 98%   BMI 40.31 kg/m    GEN: Well-nourished, well developed female in no acute distress CV: regular rate and rhythm, no murmurs appreciated  RESP: no  increased work of breathing, clear to ascultation bilaterally  ABD: Obese abdomen, bowel sounds present. Soft, Nontender, Nondistended. MSK: no lower extremity edema SKIN: warm, dry NEURO: grossly normal, moves all extremities appropriately PSYCH: Anxious, normal thought content   Results for orders placed or performed in visit on 03/25/19 (from the past 72 hour(s))  HgB A1c     Status: None   Collection Time: 03/25/19 12:05 PM  Result Value Ref Range   Hemoglobin A1C 5.5 4.0 - 5.6 %   HbA1c POC (<> result, manual entry)     HbA1c, POC (prediabetic range)     HbA1c, POC (controlled diabetic range)       Assessment/Plan:  Anxiety Patient refused to take GAD-7 and PHQ-9.  Patient states she would not take medications and does not want to take the questionnaires.  Advised patient to schedule a visit with her daughter's pediatrician to reevaluate terms of FMLA paperwork.  Educated patient regarding mental versus physical disability requirements relating to short-term disability.  Patient states that she is aware that she is not mentally or physically disabled.  Asked patient to go and speak with her HR department regarding a potential leave of absence.   Obesity Discussed 150 minutes weekly exercise recommendation and ways to increase vegetable intake and decrease intake caloric intake. The patient was asked to make an  attempt to improve diet and exercise patterns to aid in medical management of this problem.  Obtain lipid panel, BMP and a1c today.    Health care maintenance Patient had normal Pap smear last year and normal Pap with negative HPV testing in 2016.  Next Pap smear due in 2022.  Discussed the Pap smear schedule with patient. Patient denies need for STI testing at this time.  Patient declined to have influenza vaccination today.    Orders Placed This Encounter  Procedures  . Lipid Panel  . Basic Metabolic Panel  . HgB A1c    No orders of the defined types were placed in  this encounter.   Health Maintenance reviewed.  Lyndee Hensen, DO PGY-1, [facility] 03/25/2019 11:20 AM

## 2019-03-26 LAB — BASIC METABOLIC PANEL
BUN/Creatinine Ratio: 12 (ref 9–23)
BUN: 10 mg/dL (ref 6–20)
CO2: 24 mmol/L (ref 20–29)
Calcium: 9.3 mg/dL (ref 8.7–10.2)
Chloride: 106 mmol/L (ref 96–106)
Creatinine, Ser: 0.85 mg/dL (ref 0.57–1.00)
GFR calc Af Amer: 102 mL/min/{1.73_m2} (ref >59)
GFR calc non Af Amer: 88 mL/min/{1.73_m2} (ref >59)
Glucose: 91 mg/dL (ref 65–99)
Potassium: 4.6 mmol/L (ref 3.5–5.2)
Sodium: 145 mmol/L — ABNORMAL HIGH (ref 134–144)

## 2019-03-26 LAB — LIPID PANEL
Chol/HDL Ratio: 4 ratio (ref 0.0–4.4)
Cholesterol, Total: 176 mg/dL (ref 100–199)
HDL: 44 mg/dL (ref >39)
LDL Chol Calc (NIH): 117 mg/dL — ABNORMAL HIGH (ref 0–99)
Triglycerides: 78 mg/dL (ref 0–149)
VLDL Cholesterol Cal: 15 mg/dL (ref 5–40)

## 2019-04-04 ENCOUNTER — Encounter: Payer: Self-pay | Admitting: Family Medicine

## 2019-04-20 ENCOUNTER — Encounter: Payer: Self-pay | Admitting: Family Medicine

## 2019-06-29 DIAGNOSIS — Z20828 Contact with and (suspected) exposure to other viral communicable diseases: Secondary | ICD-10-CM | POA: Diagnosis not present

## 2019-06-29 DIAGNOSIS — Z7189 Other specified counseling: Secondary | ICD-10-CM | POA: Diagnosis not present

## 2019-07-06 DIAGNOSIS — U071 COVID-19: Secondary | ICD-10-CM | POA: Diagnosis not present

## 2019-07-08 ENCOUNTER — Encounter: Payer: Self-pay | Admitting: Family Medicine

## 2019-10-24 ENCOUNTER — Encounter: Payer: Self-pay | Admitting: Family Medicine

## 2019-10-30 ENCOUNTER — Other Ambulatory Visit: Payer: Self-pay

## 2019-10-30 ENCOUNTER — Encounter: Payer: Self-pay | Admitting: Family Medicine

## 2019-10-30 ENCOUNTER — Ambulatory Visit (INDEPENDENT_AMBULATORY_CARE_PROVIDER_SITE_OTHER): Payer: BC Managed Care – PPO | Admitting: Family Medicine

## 2019-10-30 DIAGNOSIS — F419 Anxiety disorder, unspecified: Secondary | ICD-10-CM

## 2019-10-30 NOTE — Patient Instructions (Signed)
Psychiatry Resource List (Adults and Children) Most of these providers will take Medicaid. please consult your insurance for a complete and updated list of available providers. When calling to make an appointment have your insurance information available to confirm you are covered.  [place]:   [place]: [address]     [phone]   [place]: [place],        [phone] [place]: [place],    [place]: [address],                   [phone] [place]: [place] [address]         [phone]  [place]  (Psychiatry & counseling ; adults & children ; will take Medicaid) [place], [place], [place]        [phone]   [place]  (Psychiatry only; Adults only, will take Medicaid)  [place], [place], [place] [address]       [phone]   [place] (Psychiatry & counseling ; adults & children ; will take Medicaid [address]  [address]  [place] [address]   Go on-line to complete referral ( [email or web address] [phone]    (Spanish therapist)  [facility]  Psychiatry & counseling; Adults and children;  Call Registration prior to scheduling an appointment [phone] [place]    [place], [place] [address]    [phone]  [facility] (Psychiatry & counseling; adults & children; Medicare no Medicaid)  [place],   [address]      [phone]    [facility] (up to age 48)  Psychiatry & counseling ,will take Medicaid, must do counseling to receive psychiatry services  [address] [place] [address]        ([place] (Psychiatry & counseling; adults & children; will take Medicaid) Will need a  referral from provider [address],  [place], [place]  [phone]  [facility]---  Walk-in Mon-Fri, 8:30-5:00 (will take Medicaid)  [address], [place], [place]  [phone]  To schedule an appointment call [phone]  [facility] --- Walk-In Mon-Friday 8am-3pm ( will take Medicaid, Psychiatry, Adults & children,  [address], [place], [place]   [phone]   [facility]--, Walk-in M-F 8am-12pm and 1pm -3pm   (Counseling, Psychiatry, will take Medicaid, adults & children)  [address], [place], [place]  [phone]     Panic Attack A panic attack is a sudden episode of severe anxiety, fear, or discomfort that causes physical and emotional symptoms. The attack may be in response to something frightening, or it may occur for no known reason. Symptoms of a panic attack can be similar to symptoms of a heart attack or stroke. It is important to see your health care provider when you have a panic attack so that these conditions can be ruled out. A panic attack is a symptom of another condition. Most panic attacks go away with treatment of the underlying problem. If you have panic attacks often, you may have a condition called panic disorder. What are the causes? A panic attack may be caused by:  An extreme, life-threatening situation, such as a war or natural disaster.  An anxiety disorder, such as post-traumatic stress disorder.  Depression.  Certain medical conditions, including heart problems, neurological conditions, and infections.  Certain over-the-counter and prescription medicines.  Illegal drugs that increase heart rate and blood pressure, such as methamphetamine.  Alcohol.  Supplements that increase anxiety.  Panic disorder. What increases the risk? You are more likely to develop this condition if:  You have an anxiety disorder.  You have another mental health condition.  You take certain  medicines.  You use alcohol, illegal drugs, or other substances.  You are under extreme stress.  A life event is causing increased feelings of anxiety and depression. What are the signs or symptoms? A panic attack starts suddenly, usually lasts about 20 minutes, and occurs with one or more of the following:  A pounding heart.  A feeling that your heart is beating irregularly or faster than normal (palpitations).  Sweating.  Trembling or shaking.  Shortness of breath or feeling smothered.  Feeling choked.  Chest pain or discomfort.  Nausea or a strange feeling in your stomach.  Dizziness, feeling lightheaded, or feeling like you might faint.  Chills or hot flashes.  Numbness or tingling in your lips, hands, or feet.  Feeling confused, or feeling that you are not yourself.  Fear of losing control or being emotionally unstable.  Fear of dying. How is this diagnosed? A panic attack is diagnosed with an assessment by your health care provider. During the assessment your health care provider will ask questions about:  Your history of anxiety, depression, and panic attacks.  Your medical history.  Whether you drink alcohol, use illegal drugs, take supplements, or take medicines. Be honest about your substance use. Your health care provider may also:  Order blood tests or other kinds of tests to rule out serious medical conditions.  Refer you to a mental health professional for further evaluation. How is this treated? Treatment depends on the cause of the panic attack:  If the cause is a medical problem, your health care provider will either treat that problem or refer you to a specialist.  If the cause is emotional, you may be given anti-anxiety medicines or referred to a counselor. These medicines may reduce how often attacks happen, reduce how severe the attacks are, and lower anxiety.  If the cause is a medicine, your health care provider may tell you to stop the  medicine, change your dose, or take a different medicine.  If the cause is a drug, treatment may involve letting the drug wear off and taking medicine to help the drug leave your body or to counteract its effects. Attacks caused by drug abuse may continue even if you stop using the drug. Follow these instructions at home:  Take over-the-counter and prescription medicines only as told by your health care provider.  If you feel anxious, limit your caffeine intake.  Take good care of your physical and mental health by: ? Eating a balanced diet that includes plenty of fresh fruits and vegetables, whole grains, lean meats, and low-fat dairy. ? Getting plenty of rest. Try to get 7-8 hours of uninterrupted sleep each night. ? Exercising regularly. Try to get 30 minutes of physical activity at least 5 days a week. ? Not smoking. Talk to your health care provider if you need help quitting. ? Limiting alcohol intake to no more than 1 drink a day for nonpregnant women and 2 drinks a day for men. One drink equals 12 oz of beer, 5 oz of  wine, or 1 oz of hard liquor.  Keep all follow-up visits as told by your health care provider. This is important. Panic attacks may have underlying physical or emotional problems that take time to accurately diagnose. Contact a health care provider if:  Your symptoms do not improve, or they get worse.  You are not able to take your medicine as prescribed because of side effects. Get help right away if:  You have serious thoughts about hurting yourself or others.  You have symptoms of a panic attack. Do not drive yourself to the hospital. Have someone else drive you or call an ambulance. If you ever feel like you may hurt yourself or others, or you have thoughts about taking your own life, get help right away. You can go to your nearest emergency department or call:  Your local emergency services (911 in the [place]  A suicide crisis helpline, such as the National  Suicide Prevention Lifeline at 209-065-0457. This is open 24 hours a day. Summary  A panic attack is a sign of a serious health or mental health condition. Get help right away. Do not drive yourself to the hospital. Have someone else drive you or call an ambulance.  Always see a health care provider to have the reasons for the panic attack correctly diagnosed.  If your panic attack was caused by a physical problem, follow your health care provider's suggestions for medicine, referral to a specialist, and lifestyle changes.  If your panic attack was caused by an emotional problem, follow through with counseling from a qualified mental health specialist.  If you feel like you may hurt yourself or others, call 911 and get help right away. This information is not intended to replace advice given to you by your health care provider. Make sure you discuss any questions you have with your health care provider. Document Revised: 05/03/2017 Document Reviewed: 06/29/2016 Elsevier Patient Education  2020 [employer].

## 2019-10-30 NOTE — Progress Notes (Signed)
    SUBJECTIVE:   CHIEF COMPLAINT / HPI: panic attacks, request for family/medical leave   Panic Attacks  Request for Medical Leave   Patient reports having a minimum of two panic attacks per day. 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 Ms. Schaffer states that her panic attacks include heart palpitations, sweating, feeling overwhelming worry about several things at once and these episodes usually last for at least an hour. She reports trying breathing exercises to help calm her down and usually she has to step awy from her work to calm down. She also adds that she has several stressors including her daughter with sickle cell anemia. She states that her daugher is admitted to the hospital 2-3 per months and frequently refuses to take her medications. She adds that she and her spouse are separated and believes it is due to his GAD and inability to get help. She reports that she has never been diagnosed with an anxiety disorder and declines medication as she worries how it will affect her. She states she would like to work with a therapist before using medications. She has never been admitted the  hosptial for anxiety and has never worked with a Paramedic before. Patient is requesting to have forms filled out to take 2 months of leave from work at the recommendation of her HR representative at her job. She also requests different therapists who will accept medicaid.   GAD Score 13 PHQ9- 15   PERTINENT  PMH / PSH:   OBJECTIVE:   BP 106/78   Pulse 92   Ht 5\' 9"  (1.753 m)   Wt 267 lb 6.4 oz (121.3 kg)   SpO2 100%   BMI 39.49 kg/m    General: female appearing stated age, anxious appearing, speaking quickly, does not appear  sweaty  Cardio: Normal S1 and S2, no S3 or S4. Rhythm is regular. No murmurs or rubs.  Bilateral radial pulses palpable Pulm: Clear to auscultation bilaterally, no crackles, wheezing, or diminished breath sounds. Normal respiratory effort Abdomen: Bowel sounds normal. Abdomen soft and non-tender. Extremities: No peripheral edema. Neuro: pt alert and oriented x4  Psych: anxious appearing throughout exam, intermittently tearful during conversation, appropriately groomed, speaks quickly in accent, thought content is linear/easy to redirect back to topic at hand when patient goes on anxious tangent, denies SI/HI, no signs of tardive dyskinesia, intermittently paces around the room, makes appropriate eye contact    ASSESSMENT/PLAN:   Anxiety GAD score elevated. Patient visibly anxious during visit and speaking rapidly. Denies signs of mania.  -declines medication  -given list of therapists who accept Medicaid  -anticipate completion of paperwork for medical leave to be completed during the first week of June and faxed to patient's job directly -recommend follow up within 1 week of starting therapy      July, MD [facility]

## 2019-11-02 NOTE — Assessment & Plan Note (Addendum)
GAD score elevated. Patient visibly anxious during visit and speaking rapidly. Denies signs of mania.  -declines medication  -given list of therapists who accept Medicaid  -anticipate completion of paperwork for medical leave to be completed during the first week of June and faxed to patient's job directly -recommend follow up within 1 week of starting therapy

## 2019-11-04 ENCOUNTER — Encounter: Payer: Self-pay | Admitting: Family Medicine

## 2019-11-06 ENCOUNTER — Encounter: Payer: Self-pay | Admitting: Family Medicine

## 2019-11-06 NOTE — Telephone Encounter (Addendum)
Patient is calling asking dr to please call her back ASAP please. Thanks Paperwork wasn't filled out entirely and need to be completed all the way and sent back.

## 2019-11-06 NOTE — Telephone Encounter (Signed)
Called Patient at telephone number listed. She was not available but left a message with her partner to have her return my call at the clinic when she is available.   Nicki Guadalajara, MD  PGY-1, [facility]

## 2019-11-09 ENCOUNTER — Encounter: Payer: Self-pay | Admitting: Family Medicine

## 2019-11-23 ENCOUNTER — Other Ambulatory Visit: Payer: Self-pay

## 2019-11-23 ENCOUNTER — Ambulatory Visit (INDEPENDENT_AMBULATORY_CARE_PROVIDER_SITE_OTHER): Payer: BC Managed Care – PPO | Admitting: Clinical

## 2019-11-23 DIAGNOSIS — F419 Anxiety disorder, unspecified: Secondary | ICD-10-CM | POA: Diagnosis not present

## 2019-11-23 DIAGNOSIS — F410 Panic disorder [episodic paroxysmal anxiety] without agoraphobia: Secondary | ICD-10-CM

## 2019-11-24 DIAGNOSIS — F410 Panic disorder [episodic paroxysmal anxiety] without agoraphobia: Secondary | ICD-10-CM | POA: Insufficient documentation

## 2019-11-24 NOTE — Progress Notes (Addendum)
Comprehensive Clinical Assessment (CCA) Note  11/23/2019 Brenda Kelley [id number]  Visit Diagnosis:      ICD-10-CM   1. Anxiety disorder, unspecified type  F41.9   2. Panic disorder  F41.0       CCA Screening, Triage and Referral (STR)  Patient Reported Information How did you hear about [place]? Self  Referral name: No data recorded Referral phone number: No data recorded  Whom do you see for routine medical problems? Primary Care  Practice/Facility Name: No data recorded Practice/Facility Phone Number: No data recorded Name of Contact: No data recorded Contact Number: No data recorded Contact Fax Number: No data recorded Prescriber Name: No data recorded Prescriber Address (if known): No data recorded  What Is the Reason for Your Visit/Call Today? No data recorded How Long Has This Been Causing You Problems? 1-6 months  What Do You Feel Would Help You the Most Today? Assessment Only;Therapy   Have You Recently Been in Any Inpatient Treatment (Hospital/Detox/Crisis Center/28-Day Program)? No  Name/Location of Program/Hospital:No data recorded How Long Were You There? No data recorded When Were You Discharged? No data recorded  Have You Ever Received Services From [facility] Before? Yes  Who Do You See at [facility]? No data recorded  Have You Recently Had Any Thoughts About Hurting Yourself? No  Are You Planning to Commit Suicide/Harm Yourself At This time? No   Have you Recently Had Thoughts About Hurting Someone Brenda Kelley? No  Explanation: No data recorded  Have You Used Any Alcohol or Drugs in the Past 24 Hours? No  How Long Ago Did You Use Drugs or Alcohol? No data recorded What Did You Use and How Much? No data recorded  Do You Currently Have a Therapist/Psychiatrist? No  Name of Therapist/Psychiatrist: No data recorded  Have You Been Recently Discharged From Any Office Practice or Programs? No  Explanation of Discharge From Practice/Program: No data  recorded    CCA Screening Triage Referral Assessment Type of Contact: Tele-Assessment  Is this Initial or Reassessment? Initial Assessment  Date Telepsych consult ordered in [facility]:  No data recorded Time Telepsych consult ordered in [facility]:  No data recorded  Patient Reported Information Reviewed? Yes  Patient Left Without Being Seen? No data recorded Reason for Not Completing Assessment: No data recorded  Collateral Involvement: No data recorded  Does Patient Have a Court Appointed Legal Guardian? No data recorded Name and Contact of Legal Guardian: No data recorded If Minor and Not Living with Parent(s), Who has Custody? No data recorded Is CPS involved or ever been involved? Never  Is APS involved or ever been involved? Never   Patient Determined To Be At Risk for Harm To Self or Others Based on Review of Patient Reported Information or Presenting Complaint? No  Method: No data recorded Availability of Means: No data recorded Intent: No data recorded Notification Required: No data recorded Additional Information for Danger to Others Potential: No data recorded Additional Comments for Danger to Others Potential: No data recorded Are There Guns or Other Weapons in Your Home? No data recorded Types of Guns/Weapons: No data recorded Are These Weapons Safely Secured?                            No data recorded Who Could Verify You Are Able To Have These Secured: No data recorded Do You Have any Outstanding Charges, Pending Court Dates, Parole/Probation? No data recorded Contacted To Inform of Risk  of Harm To Self or Others: No data recorded  Location of Assessment: [facility]   Does Patient Present under Involuntary Commitment? No  IVC Papers Initial File Date: No data recorded  [place] of Residence: [place]   Patient Currently Receiving the Following Services: Not Receiving Services   Determination of Need: Routine (7 days)   Options For Referral:  Outpatient Therapy     CCA Biopsychosocial  Intake/Chief Complaint:  CCA Intake With Chief Complaint CCA Part Two Date: 11/23/19 CCA Part Two Time: 1000 Chief Complaint/Presenting Problem: Client reported problems with anxniety and panic attacks that interfere with day to day functioning. Patient's Currently Reported Symptoms/Problems: anxiousness, irritability, panic attacks, and nervousness Individual's Preferences: Client reported she would like to learn more about anxiety. Type of Services Patient Feels Are Needed: Therapy and psychiatry  Mental Health Symptoms Depression:  Depression: None  Mania:  Mania: N/A  Anxiety:   Anxiety: Difficulty concentrating, Fatigue, Irritability, Restlessness, Sleep, Tension, Worrying  Psychosis:  Psychosis: None  Trauma:  Trauma: N/A  Obsessions:  Obsessions: N/A  Compulsions:  Compulsions: N/A  Inattention:  Inattention: N/A  Hyperactivity/Impulsivity:  Hyperactivity/Impulsivity: N/A  Oppositional/Defiant Behaviors:  Oppositional/Defiant Behaviors: N/A  Emotional Irregularity:  Emotional Irregularity: N/A  Other Mood/Personality Symptoms:      Mental Status Exam Appearance and self-care  Stature:  Stature: Average  Weight:  Weight: Average weight  Clothing:  Clothing: Casual  Grooming:  Grooming: Normal  Cosmetic use:  Cosmetic Use: Age appropriate  Posture/gait:  Posture/Gait: Normal  Motor activity:  Motor Activity: Not Remarkable  Sensorium  Attention:  Attention: Normal  Concentration:  Concentration: Normal  Orientation:  Orientation: X5  Recall/memory:  Recall/Memory: Normal  Affect and Mood  Affect:  Affect: Congruent  Mood:  Mood: Other (Comment)  Relating  Eye contact:  Eye Contact: Normal  Facial expression:  Facial Expression: Responsive  Attitude toward examiner:  Attitude Toward Examiner: Cooperative  Thought and Language  Speech flow: Speech Flow: Clear and Coherent  Thought content:  Thought Content: Appropriate  to Mood and Circumstances  Preoccupation:  Preoccupations: None  Hallucinations:  Hallucinations: Other (Comment)  Organization:     Transport planner of Knowledge:  Fund of Knowledge: Fair  Intelligence:  Intelligence: Average  Abstraction:  Abstraction: Normal  Judgement:  Judgement: Good  Reality Testing:  Reality Testing: Realistic  Insight:  Insight: Good  Decision Making:  Decision Making: Normal  Social Functioning  Social Maturity:  Social Maturity: Responsible  Social Judgement:  Social Judgement: Normal  Stress  Stressors:  Stressors: Relationship, Work  Coping Ability:  Coping Ability: Materials engineer Deficits:  Skill Deficits: Self-care  Supports:  Supports: Family, Friends/Service system     Religion: Religion/Spirituality Are You A Religious Person?: Yes  Leisure/Recreation: Leisure / Recreation Do You Have Hobbies?: Yes  Exercise/Diet: Exercise/Diet Do You Exercise?: No Have You Gained or Lost A Significant Amount of Weight in the Past Six Months?: No Do You Follow a Special Diet?: No Do You Have Any Trouble Sleeping?: Yes Explanation of Sleeping Difficulties: Client reported having a hard time falling asleep.   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  Education: Education  Did You Graduate From [employer]?: Yes   CCA Family/Childhood History  Family and Relationship History: Family history Marital status: Separated Separated, when?: Client reported since February /March 2021 What types of issues is patient dealing with in the relationship?: Client reported her  husbands mental health problems with anxiety and depression prevented them from doing things together as a couple and spending time out with the kids. Does patient have children?: Yes How many children?: 3 How is patient's relationship with their children?: Good  Childhood History:  Childhood History By whom was/is the patient raised?: Both parents Additional childhood history information: Client reported she had a good childhood. Client reported her mother got married later on in her childhood to her step father who was also a good parent. Description of patient's relationship with caregiver when they were a child: Client reported from 62 to 43 having a distant relationship with her mother. Patient's description of current relationship with people who raised him/her: Client reported her mother lives in [place] but they speak on a regular basis. Client reported she is not sure about her biological fathers whereabouts. Does patient have siblings?: Yes Did patient suffer any verbal/emotional/physical/sexual abuse as a child?: No Did patient suffer from severe childhood neglect?: No Has patient ever been sexually abused/assaulted/raped as an adolescent or adult?: No Was the patient ever a victim of a crime or a disaster?: No Witnessed domestic violence?: No Has patient been affected by domestic violence as an adult?: No  Child/Adolescent Assessment:     CCA Substance Use  Alcohol/Drug Use: Alcohol / Drug Use History of alcohol / drug use?: No history of alcohol / drug abuse                         ASAM's:  Six Dimensions of Multidimensional Assessment  Dimension 1:  Acute Intoxication and/or Withdrawal Potential:      Dimension 2:  Biomedical Conditions and Complications:      Dimension 3:  Emotional, Behavioral, or Cognitive Conditions and Complications:     Dimension 4:  Readiness to Change:     Dimension 5:  Relapse, Continued use, or Continued Problem Potential:      Dimension 6:  Recovery/Living Environment:     ASAM Severity Score:    ASAM Recommended Level of Treatment:     Substance use Disorder (SUD)    Recommendations for Services/Supports/Treatments:    DSM5 Diagnoses: Patient Active Problem List   Diagnosis Date Noted  . Panic disorder 11/24/2019  . Anxiety disorder 03/25/2019  . Keloid 10/14/2017  . Encounter for other general counseling or advice on contraception 09/15/2014  . Health care maintenance 05/13/2014  . Sickle cell trait (HCC) 03/02/2014  . Obesity 03/01/2014  . 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 Client is presenting via self -referral for behavioral health services.   Client states mental health symptoms as evidenced by feeling on edge and panic.  Client denies suicidal and homicidal ideations at this time. Client denies hallucinations and delusions at this time. Client reported no substance use history.  Client was screened for the following SDOH:  GAD 7 : Generalized Anxiety Score 11/23/2019 10/30/2019  Nervous, Anxious, on Edge 2 (No Data)  Control/stop worrying 3 -  Worry too much - different things 3 -  Trouble relaxing 3 -  Restless 1 -  Easily annoyed or irritable 3 -  Afraid - awful might happen 1 -  Total GAD 7 Score 16 -  Anxiety Difficulty Very difficult -     Client meets criteria for ANXIETY DISORDER, UNSPECIFIED TYPE evidenced by the clients report of excessive anxiety and worry occurring more days than not, and finds it difficult to control. Client endorses restlessness/ feeling on edge, difficulty concentrating, irritability, and sleep disturbance trouble falling and staying asleep. Client reported the anxiousness started in May 2021 along with panic attacks.   Client meets criteria for PANIC DISORDER evidenced by the clients report of  recurrent unexpected panic attacks associated with palpitations, shortness of breath, heat sensations, feeling afraid to sleep,and worry about additional panic attacks. Client reported the symptoms have persisted since May 2021. Client reported the panic attacks occur consistently two to three times a week. Client stated, "I get stuck in a moment, feeling nervous". Client reported the frequency of the attacks interfered with work causing her to clock out frequently. Client reported she is currently on short term disability due to her symptoms.   Treatment recommendations are individual therapy with psychiatric evaluation and medication management as deemed necessary by the doctor.  Clinician provided information on format of appointment (virtual or face to face).    Client was in agreement with treatment recommendations.  Virtual Visit via Video Note  I connected with Brenda Kelley on 11/23/2019 at 10:00 AM EDT by a video enabled telemedicine application and verified that I am speaking with the correct person using two identifiers.  Location: Patient: Home Provider: Office   I discussed the limitations of evaluation and management by telemedicine and the availability of in person appointments. The patient expressed understanding and agreed to proceed.   Follow Up Instructions:    I discussed the assessment and treatment plan with the patient. The patient was provided an opportunity to ask questions and all were answered. The patient agreed with the plan and demonstrated an understanding of the instructions.   The patient was advised to call back or seek an in-person evaluation if the symptoms worsen or if the condition fails to improve as anticipated.  I provided 60 minutes of non-face-to-face time during this encounter.   Neena Rhymes Jakell Trusty, LCSW    Referrals to Alternative Service(s): Referred to Alternative Service(s):   Place:   Date:   Time:    Referred to Alternative Service(s):    Place:   Date:   Time:    Referred to Alternative Service(s):   Place:   Date:   Time:    Referred to Alternative Service(s):   Place:   Date:   Time:     Loree Fee

## 2019-12-10 ENCOUNTER — Other Ambulatory Visit: Payer: Self-pay

## 2019-12-10 ENCOUNTER — Ambulatory Visit (INDEPENDENT_AMBULATORY_CARE_PROVIDER_SITE_OTHER): Payer: BC Managed Care – PPO | Admitting: Clinical

## 2019-12-10 DIAGNOSIS — F419 Anxiety disorder, unspecified: Secondary | ICD-10-CM | POA: Diagnosis not present

## 2019-12-11 NOTE — Progress Notes (Signed)
   THERAPIST PROGRESS NOTE  Session Time: 45 minutes  Participation Level: Active  Behavioral Response: CasualAlertEuthymic  Type of Therapy: Individual Therapy  Treatment Goals addressed: Anxiety  Interventions: CBT  Summary:  Brenda Kelley is a 39 y.o. female who presented for session via video. Client presented oriented times five, appropriately dressed, and friendly. Client since the last session she has had a anxiety attack and struggled with feeling overwhelmed. Client reported her sleep schedule has also been irregular.  Client reported her worries have been circling around thoughts of not being abe to do things with her children due to being out of work and her day to day activity being affected by her anxiety. Client intrusive and overwhelming thoughts of "will I ever become my normal self again, am I stuck?". Client discussed prior to her anxiety taking her out of work, stress from work was increasing building up. Client reported although she worked from home she had feelings of dread. Client reported the expectations from managers and the rude interaction from customers became overwhelming. Client reported she began reflection of her personal capabilities and what she would like to do next in her career. Client reported her coping skills have included directing herself to sit still and attempting to not think about anything until the feeling of anxiety passes. Client discussed feelings of "I just want to be left alone, things become a bother to me". Client discussed with the therapist journaling her thoughts as they come to discuss in the next session. Client stated, "thinking about doing it seems hard" but she is going to try.    Suicidal/Homicidal: Nowithout intent/plan  Therapist Response:  Therapist began the session by doing a mood check in. Therapist allowed time to focus on the clients thoughts and feelings. Therapist worked toward a therapeutic relationship with eye  contact and offering empathy. Therapist educated the client about symptoms of anxiety and panic. Therapist discussed symptoms of burnout.  Therapist discussed the homework assignment of journaling her reoccurring intrusive thoughts.   Plan: Return again in 2 weeks for individual therapy.  Diagnosis: Anxiety disorder, unspecified  Virtual Visit via Video Note  I connected with Brenda Kelley on 12/11/19 at 11:00 AM EDT by a video enabled telemedicine application and verified that I am speaking with the correct person using two identifiers.  Location: Patient: Home Provider: Office   I discussed the limitations of evaluation and management by telemedicine and the availability of in person appointments. The patient expressed understanding and agreed to proceed.   Follow Up Instructions:    I discussed the assessment and treatment plan with the patient. The patient was provided an opportunity to ask questions and all were answered. The patient agreed with the plan and demonstrated an understanding of the instructions.   The patient was advised to call back or seek an in-person evaluation if the symptoms worsen or if the condition fails to improve as anticipated.  I provided 45 minutes of non-face-to-face time during this encounter.   Neena Rhymes Tanaiya Kolarik, LCSW   [employer], LCSW 12/11/2019

## 2019-12-22 ENCOUNTER — Other Ambulatory Visit: Payer: Self-pay

## 2019-12-22 ENCOUNTER — Telehealth (INDEPENDENT_AMBULATORY_CARE_PROVIDER_SITE_OTHER): Payer: BC Managed Care – PPO | Admitting: Psychiatry

## 2019-12-22 ENCOUNTER — Telehealth (HOSPITAL_COMMUNITY): Payer: BC Managed Care – PPO | Admitting: Psychiatry

## 2019-12-22 ENCOUNTER — Encounter (HOSPITAL_COMMUNITY): Payer: Self-pay | Admitting: Psychiatry

## 2019-12-22 DIAGNOSIS — F331 Major depressive disorder, recurrent, moderate: Secondary | ICD-10-CM | POA: Insufficient documentation

## 2019-12-22 DIAGNOSIS — F411 Generalized anxiety disorder: Secondary | ICD-10-CM

## 2019-12-22 HISTORY — DX: Major depressive disorder, recurrent, moderate: F33.1

## 2019-12-22 MED ORDER — HYDROXYZINE HCL 10 MG PO TABS: 10.0000 mg | ORAL_TABLET | Freq: Three times a day (TID) | ORAL | 1 refills | Status: DC | PRN

## 2019-12-22 MED ORDER — FLUOXETINE HCL 20 MG PO CAPS: 20.0000 mg | ORAL_CAPSULE | Freq: Every day | ORAL | 1 refills | Status: DC

## 2019-12-22 NOTE — Progress Notes (Signed)
Psychiatric Initial Adult Assessment  Virtual Visit via Video Note  I connected with Brenda Kelley on 12/22/19 at  4:00 PM EDT by a video enabled telemedicine application and verified that I am speaking with the correct person using two identifiers.  Location: Patient: Home Provider: Clinic   I discussed the limitations of evaluation and management by telemedicine and the availability of in person appointments. The patient expressed understanding and agreed to proceed.  I provided 45 minutes of non-face-to-face time during this encounter.   Patient Identification: Brenda Kelley MRN:  [id number] Date of Evaluation:  12/22/2019 Referral Source: [name] Chief Complaint:  "I have wide spread anxiety" Visit Diagnosis:    ICD-10-CM   1. Moderate episode of recurrent major depressive disorder (HCC)  F33.1 FLUoxetine (PROZAC) 20 MG capsule  2. Generalized anxiety disorder  F41.1 FLUoxetine (PROZAC) 20 MG capsule    hydrOXYzine (ATARAX/VISTARIL) 10 MG tablet    History of Present Illness:  39 year old female seen today for initial psychiatric evaluation. She was referred to outpatient psychiatry by her PCP for medication management. She has a psychiatric history of anxiety. She is currently not percribed any psychiatric medications and notes that her anxiety is wide spread.  Today she reports recent increased anxiety, panic attacks, and depressive symptoms. She endorses  depressive symptoms such as anhedonia, guilt or hopelessness, decreased libido, decreased appetite, difficulty concentrating, distractibility/zoning out when completing daily task such as washing dishes, and overall depressed mood.  Patient denies any recent weight loss. Patient also reports anxiety symptoms with daily panic attacks, sudden feelings of nervousness, increased irritability, poor sleep, and feeling like her mind is always racing.  Patient reports that she usually can't fall asleep until about  3 or 4 am and then she wakes back up for work at 8 am.  Patient denies any symptoms of mania. She also reports frequently feeling overwhelmed by stress related to her 37 year old daughter's chronic health condition and difficulty transitioning from city life in [place] to a less busy life here in [place].  Patient reports that her anxiety and depressive symptoms have also affected her functionality at work.  Patient reports that she works as a Occupational psychologist for [employer] and is required to talk on the phone for up to 9 hours at a time.  She has noticed that this makes her anxiety increase, she feels more irritable, and she experiences panic attack symptoms such as increased heart rate and nervousness out of the blue.  She reports that she is still working and has not been using the phones lately, but her symptoms are unchanged.  She denies any known trigger for these symptoms while working.  She reports that she has recently been advised by her supervisor to take a medical leave of absence until her symptoms improve.  Patient has provided documentation via fax/email to be completed for her short-term disability claim.  Provider discussed that this information will be completed in detail and sent to the company by tomorrow.  Provider discussed with the patient about starting Prozac 20 mg every morning to reduce anxiety and depressive symptoms and Hydroxyzine 10 mg as needed three times daily to manage her panic attack symptoms.  Patient is agreeable with this plan.  Patient has recently started therapy and will continue to follow-up with her therapist as scheduled.  Patient inquired about additional ways to cope with her anxiety.  Provider discussed and demonstrated how to perform deep breathing exercises, use grounding techniques, and  use her five senses as a calming technique.  Patient denies any SI/HI/AVH.  Patient denies any additional questions or concerns at this time.   Associated  Signs/Symptoms: Depression Symptoms:  depressed mood, anhedonia, insomnia, psychomotor agitation, feelings of worthlessness/guilt, difficulty concentrating, anxiety, panic attacks, decreased labido, decreased appetite, (Hypo) Manic Symptoms:  Distractibility, Flight of Ideas, Impulsivity, Irritable Mood, Anxiety Symptoms:  Excessive Worry, Panic Symptoms, Psychotic Symptoms:  Denies PTSD Symptoms: NA  Past Psychiatric History: Anxiety  Previous Psychotropic Medications: No   Substance Abuse History in the last 12 months:  No.  Consequences of Substance Abuse: NA  Past Medical History:  Past Medical History:  Diagnosis Date  . Medical history non-contributory   . Sickle cell trait (HCC)   . Vaginal Pap smear, abnormal 2004    Past Surgical History:  Procedure Laterality Date  . CESAREAN SECTION     x 2  . CESAREAN SECTION WITH BILATERAL TUBAL LIGATION Bilateral 08/21/2013   Procedure: CESAREAN SECTION ;  Surgeon: Lesly Dukes, MD;  Location: [facility];  Service: Obstetrics;  Laterality: Bilateral;  Repeat  ; No bilateral tubal ligation performed  . Keloid removal      Family Psychiatric History: Denies  Family History:  Family History  Problem Relation Age of Onset  . Heart disease Mother   . Hypertension Mother   . Alcohol abuse Mother   . Stroke Mother   . Cancer Maternal Aunt 40       cancer    Social History:   Social History   Socioeconomic History  . Marital status: Legally Separated    Spouse name: Not on file  . Number of children: Not on file  . Years of education: Not on file  . Highest education level: Not on file  Occupational History  . Not on file  Tobacco Use  . Smoking status: Never Smoker  . Smokeless tobacco: Never Used  Substance and Sexual Activity  . Alcohol use: Yes    Comment: socially  . Drug use: No  . Sexual activity: Yes    Birth control/protection: Condom  Other Topics Concern  . Not on file  Social History  Narrative   As of 03/01/2014    Education up to 11th grade.   Unemployed , looking for job   No driver's license. Thinking about it but scared of other drivers.    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   No exercise regularly.   No h/o tobacco use   No drug use   Social EtOH use.   Does not feel at risk for STD   Does feel safe in relationship.          Social Determinants of Health   Financial Resource Strain:   . Difficulty of Paying Living Expenses:   Food Insecurity:   . Worried About Programme researcher, broadcasting/film/video in the Last Year:   . Barista in the Last Year:   Transportation Needs:   . Freight forwarder (Medical):   Marland Kitchen Lack of Transportation (Non-Medical):   Physical Activity:   . Days of Exercise per Week:   . Minutes of Exercise per Session:   Stress:   . Feeling of Stress :   Social Connections:   . Frequency of Communication with Friends and Family:   . Frequency  of Social Gatherings with Friends and Family:   . Attends Religious Services:   . Active Member of Clubs or Organizations:   . Attends BankerClub or Organization Meetings:   Marland Kitchen. Marital Status:     Additional Social History: [place] with her three children. She is separated. She denies alcohol, tobacco, and illicit drug use.   Allergies:  No Known Allergies  Metabolic Disorder Labs: Lab Results  Component Value Date   HGBA1C 5.5 03/25/2019   No results found for: PROLACTIN Lab Results  Component Value Date   CHOL 176 03/25/2019   TRIG 78 03/25/2019   HDL 44 03/25/2019   CHOLHDL 4.0 03/25/2019   VLDL 12 09/19/2015   LDLCALC 117 (H) 03/25/2019   LDLCALC 107 (H) 09/18/2017   Lab Results  Component Value Date   TSH 0.831 05/13/2014    Therapeutic Level Labs: No results found for: LITHIUM No results found for: CBMZ No results found for:  VALPROATE  Current Medications: Current Outpatient Medications  Medication Sig Dispense Refill  . FLUoxetine (PROZAC) 20 MG capsule Take 1 capsule (20 mg total) by mouth daily. 30 capsule 1  . hydrOXYzine (ATARAX/VISTARIL) 10 MG tablet Take 1 tablet (10 mg total) by mouth 3 (three) times daily as needed. 30 tablet 1  . Multiple Vitamins-Minerals (MULTIVITAMIN WITH MINERALS) tablet Take 1 tablet by mouth daily.    . valACYclovir (VALTREX) 500 MG tablet TAKE 1 TABLET BY MOUTH TWICE DAILY FOR 5 DAYS WHEN SYMPTOMS BEGIN 30 tablet 0   No current facility-administered medications for this visit.    Musculoskeletal: Strength & Muscle Tone: Unable to assess due to telehealth visit Gait & Station: Unable to assess due to telehealth visit Patient leans: N/A  Psychiatric Specialty Exam: Review of Systems  There were no vitals taken for this visit.There is no height or weight on file to calculate BMI.  General Appearance: Well Groomed  Eye Contact:  Good  Speech:  Clear and Coherent and Normal Rate  Volume:  Normal  Mood:  Anxious and Depressed  Affect:  Congruent  Thought Process:  Coherent, Goal Directed and Linear  Orientation:  Full (Time, Place, and Person)  Thought Content:  WDL and Logical  Suicidal Thoughts:  No  Homicidal Thoughts:  No  Memory:  Immediate;   Good Recent;   Good Remote;   Good  Judgement:  Good  Insight:  Good  Psychomotor Activity:  Normal  Concentration:  Concentration: Good and Attention Span: Good  Recall:  Good  Fund of Knowledge:Good  Language: Good  Akathisia:  No  Handed:  Right  AIMS (if indicated):  Not done  Assets:  Communication Skills Desire for Improvement Financial Resources/Insurance Housing Social Support  ADL's:  Intact  Cognition: WNL  Sleep:  Good   Screenings: GAD-7     [facility]  Total GAD-7 Score 16    PHQ2-9     [facility] Visit [facility] Visit [facility]2017 [facility]12/2016 in [facility]  PHQ-2 Total Score 3 0 0 0 0      Assessment and Plan: Patient reports recent increased anxiety, panic attacks, and depressive symptoms. Provider discussed with patient about starting Prozac 20 mg every morning to reduce anxiety  and depressive symptoms and Hydroxyzine 10 mg as needed three times daily to manage her panic attack symptoms. Patient is agreeable to start this medication regimen at this time.  1. Generalized anxiety disorder  Start- FLUoxetine (PROZAC) 20 MG capsule; Take 1 capsule (20 mg total) by mouth daily.  Dispense: 30 capsule; Refill: 1 Start- hydrOXYzine (ATARAX/VISTARIL) 10 MG tablet; Take 1 tablet (10 mg total) by mouth 3 (three) times daily as needed.  Dispense: 30 tablet; Refill: 1  2. Moderate episode of recurrent major depressive disorder (HCC)  Start- FLUoxetine (PROZAC) 20 MG capsule; Take 1 capsule (20 mg total) by mouth daily.  Dispense: 30 capsule; Refill: 1   Follow-up in 1 month. Continue to follow-up with therapy as scheduled.    [name], NP 7/20/20215:36 PM

## 2020-01-01 ENCOUNTER — Other Ambulatory Visit: Payer: Self-pay

## 2020-01-01 ENCOUNTER — Ambulatory Visit (HOSPITAL_COMMUNITY): Payer: Medicaid Other | Admitting: Clinical

## 2020-02-02 ENCOUNTER — Telehealth (HOSPITAL_COMMUNITY): Payer: BC Managed Care – PPO | Admitting: Psychiatry

## 2020-02-03 ENCOUNTER — Other Ambulatory Visit: Payer: Self-pay

## 2020-02-03 ENCOUNTER — Telehealth (HOSPITAL_COMMUNITY): Payer: BC Managed Care – PPO | Admitting: Psychiatry

## 2020-02-27 ENCOUNTER — Encounter: Payer: Self-pay | Admitting: Family Medicine

## 2020-03-03 MED ORDER — VALACYCLOVIR HCL 500 MG PO TABS: ORAL_TABLET | ORAL | 3 refills | Status: DC

## 2020-06-06 DIAGNOSIS — Z20822 Contact with and (suspected) exposure to covid-19: Secondary | ICD-10-CM | POA: Diagnosis not present

## 2020-06-22 ENCOUNTER — Encounter: Payer: Self-pay | Admitting: Family Medicine

## 2020-07-02 ENCOUNTER — Encounter: Payer: Self-pay | Admitting: Family Medicine

## 2020-07-06 ENCOUNTER — Encounter: Payer: BC Managed Care – PPO | Admitting: Family Medicine

## 2020-07-12 ENCOUNTER — Encounter: Payer: Self-pay | Admitting: Family Medicine

## 2020-07-19 ENCOUNTER — Ambulatory Visit (INDEPENDENT_AMBULATORY_CARE_PROVIDER_SITE_OTHER): Payer: Medicaid Other | Admitting: Family Medicine

## 2020-07-19 DIAGNOSIS — Z131 Encounter for screening for diabetes mellitus: Secondary | ICD-10-CM | POA: Insufficient documentation

## 2020-07-19 DIAGNOSIS — Z5329 Procedure and treatment not carried out because of patient's decision for other reasons: Secondary | ICD-10-CM

## 2020-07-19 DIAGNOSIS — Z0000 Encounter for general adult medical examination without abnormal findings: Secondary | ICD-10-CM | POA: Insufficient documentation

## 2020-07-19 DIAGNOSIS — Z91199 Patient's noncompliance with other medical treatment and regimen due to unspecified reason: Secondary | ICD-10-CM | POA: Insufficient documentation

## 2020-07-19 NOTE — Progress Notes (Signed)
    Patient was a no-show to her appointment today 07/19/2020.  Patient also a no-show to her 02/11/2019 appointment.  Patient called at home and number provided [phone], no answer, left voicemail.  We will also send patient letter today regarding no-show/late cancellation policy.  Health maintenance: COVID, Flu, Hep C screening Patient due for Pap April 2022    [name], DO [facility]

## 2020-08-15 DIAGNOSIS — Z124 Encounter for screening for malignant neoplasm of cervix: Secondary | ICD-10-CM | POA: Insufficient documentation

## 2020-08-15 NOTE — Patient Instructions (Signed)
Thank you for coming in to see [place] today! Please see below to review our plan for today's visit:  1. We are checking today for HPV, Gonorrhea, Chlamydia, HIV, Hepatitis C and Syphilis. We will call you with results and treat as needed.  2. Congrats on getting your flu shot today! Please think about getting your COVID shot. Remember that we have Pfizer here!   Please call the clinic at [phone] if your symptoms worsen or you have any concerns. It was our pleasure to serve you!   Dr. [name]

## 2020-08-15 NOTE — Progress Notes (Signed)
    SUBJECTIVE:   CHIEF COMPLAINT / HPI:   Pap: Patient presents today for Pap smear.  Also desires screening for other STIs.  Screening for diabetes mellitus: Patient desires to have her A1c rechecked today due to her excess weight (BMI 40).  Previous A1c's are normal.  A1c at today's visit 5.3%.  PERTINENT  PMH / PSH:  Patient Active Problem List   Diagnosis Date Noted  . Need for immunization against influenza 08/16/2020  . Encounter for hepatitis C screening test for low risk patient 08/16/2020  . Routine screening for STI (sexually transmitted infection) 08/16/2020  . Encounter for Papanicolaou smear for cervical cancer screening 08/15/2020  . Screening for diabetes mellitus 07/19/2020  . Well adult on routine health check 07/19/2020  . No-show for appointment 07/19/2020  . Moderate episode of recurrent major depressive disorder (HCC) 12/22/2019  . Panic disorder 11/24/2019  . Anxiety disorder 03/25/2019  . Keloid 10/14/2017  . Encounter for other general counseling or advice on contraception 09/15/2014  . Health care maintenance 05/13/2014  . Sickle cell trait (HCC) 03/02/2014  . Obesity 03/01/2014  . 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     OBJECTIVE:   BP 115/70   Pulse 87   Ht 5\' 9"  (1.753 m)   Wt 273 lb 3.2 oz (123.9 kg)   LMP 07/17/2020   SpO2 98%   BMI 40.34 kg/m    Physical exam: General: Well-appearing patient, no apparent distress Respiratory: comfortable work of breathing on room air Cardiac: RRR, S1-S2 present, no murmurs appreciated GU: No lesions to labia minora or majora, scant vaginal discharge that is clear; cervix is very posterior, os closed and without lesion  ASSESSMENT/PLAN:   Encounter for hepatitis C screening test for low risk patient -Patient screened for hepatitis C  Encounter for Papanicolaou smear for cervical cancer screening -Pap smear performed today with reflex HPV testing  Need for immunization against  influenza -Influenza vaccine received at today's visit  Routine screening for STI (sexually transmitted infection) -Patient screened today for gonorrhea, chlamydia, syphilis, HIV. -We will follow-up with results and treat as needed  Screening for diabetes mellitus Due to patient's BMI of 40 she was screened for A1c (by patient's request). -A1c normal at 5.3%     07/19/2020, DO [facility]

## 2020-08-16 ENCOUNTER — Ambulatory Visit (INDEPENDENT_AMBULATORY_CARE_PROVIDER_SITE_OTHER): Payer: Medicaid Other | Admitting: Family Medicine

## 2020-08-16 ENCOUNTER — Other Ambulatory Visit: Payer: Self-pay

## 2020-08-16 ENCOUNTER — Other Ambulatory Visit (HOSPITAL_COMMUNITY)
Admission: RE | Admit: 2020-08-16 | Discharge: 2020-08-16 | Disposition: A | Discharge status: Home / Self Care | Payer: Medicaid Other | Source: Ambulatory Visit | Attending: Family Medicine | Admitting: Family Medicine

## 2020-08-16 ENCOUNTER — Encounter: Payer: Self-pay | Admitting: Family Medicine

## 2020-08-16 VITALS — BP 115/70 | HR 87 | Ht 69.0 in | Wt 273.2 lb

## 2020-08-16 DIAGNOSIS — Z23 Encounter for immunization: Secondary | ICD-10-CM | POA: Diagnosis not present

## 2020-08-16 DIAGNOSIS — N760 Acute vaginitis: Secondary | ICD-10-CM | POA: Insufficient documentation

## 2020-08-16 DIAGNOSIS — Z131 Encounter for screening for diabetes mellitus: Secondary | ICD-10-CM

## 2020-08-16 DIAGNOSIS — Z124 Encounter for screening for malignant neoplasm of cervix: Secondary | ICD-10-CM | POA: Diagnosis not present

## 2020-08-16 DIAGNOSIS — Z113 Encounter for screening for infections with a predominantly sexual mode of transmission: Secondary | ICD-10-CM | POA: Diagnosis not present

## 2020-08-16 DIAGNOSIS — B9689 Other specified bacterial agents as the cause of diseases classified elsewhere: Secondary | ICD-10-CM | POA: Insufficient documentation

## 2020-08-16 DIAGNOSIS — Z1159 Encounter for screening for other viral diseases: Secondary | ICD-10-CM | POA: Diagnosis not present

## 2020-08-16 HISTORY — DX: Encounter for screening for other viral diseases: Z11.59

## 2020-08-16 LAB — POCT GLYCOSYLATED HEMOGLOBIN (HGB A1C): Hemoglobin A1C: 5.3 % (ref 4.0–5.6)

## 2020-08-16 NOTE — Assessment & Plan Note (Signed)
>>  ASSESSMENT AND PLAN FOR ROUTINE SCREENING FOR STI (SEXUALLY TRANSMITTED INFECTION) WRITTEN ON 08/16/2020  1:15 PM BY [name], DO  -Patient screened today for gonorrhea, chlamydia, syphilis, HIV. -We will follow-up with results and treat as needed

## 2020-08-16 NOTE — Assessment & Plan Note (Signed)
Due to patient's BMI of 40 she was screened for A1c (by patient's request). -A1c normal at 5.3%

## 2020-08-16 NOTE — Assessment & Plan Note (Signed)
-  Patient screened for hepatitis C

## 2020-08-16 NOTE — Assessment & Plan Note (Signed)
-  Influenza vaccine received at today's visit

## 2020-08-16 NOTE — Assessment & Plan Note (Signed)
-  Pap smear performed today with reflex HPV testing

## 2020-08-16 NOTE — Assessment & Plan Note (Signed)
-  Patient screened today for gonorrhea, chlamydia, syphilis, HIV. -We will follow-up with results and treat as needed

## 2020-08-17 LAB — HEPATITIS C ANTIBODY: Hep C Virus Ab: <0.1 s/co ratio (ref 0.0–0.9)

## 2020-08-17 LAB — HIV ANTIBODY (ROUTINE TESTING W REFLEX): HIV Screen 4th Generation wRfx: NONREACTIVE

## 2020-08-17 LAB — RPR: RPR Ser Ql: NONREACTIVE

## 2020-08-18 ENCOUNTER — Encounter: Payer: Self-pay | Admitting: Family Medicine

## 2020-08-18 LAB — CYTOLOGY - PAP
Adequacy: ABSENT
Chlamydia: NEGATIVE
Comment: NEGATIVE
Comment: NEGATIVE
Comment: NORMAL
Diagnosis: NEGATIVE
High risk HPV: NEGATIVE
Neisseria Gonorrhea: NEGATIVE

## 2020-08-24 ENCOUNTER — Encounter: Payer: Self-pay | Admitting: Family Medicine

## 2020-08-25 ENCOUNTER — Other Ambulatory Visit: Payer: Medicaid Other

## 2020-08-29 ENCOUNTER — Other Ambulatory Visit: Payer: Self-pay | Admitting: Family Medicine

## 2020-08-29 MED ORDER — NORETHINDRONE 0.35 MG PO TABS: 1.0000 | ORAL_TABLET | Freq: Every day | ORAL | 11 refills | Status: DC

## 2020-08-29 NOTE — Progress Notes (Signed)
At patient request was restarted on OCP.  Patient is 40 year old with BMI of 40, is a non-smoker.  Patient requested to be started on Ortho Tri-Cyclen combination birth control pill.  Discussion was had with Ms. Brahmbhatt regarding risk factors for clots increasing with estrogen.  Alternative for progesterone only medication was discussed, patient opted for progesterone only OCP.  Alternatives such as IUD, Depo shot, Nexplanon implant were discussed and respectfully declined.  Prescription sent to patient's pharmacy.  [name], DO [facility], PGY-3 08/29/2020 8:08 AM

## 2020-09-07 ENCOUNTER — Encounter: Payer: Self-pay | Admitting: Family Medicine

## 2020-09-08 MED ORDER — VALACYCLOVIR HCL 500 MG PO TABS: ORAL_TABLET | ORAL | 3 refills | Status: DC

## 2020-09-22 ENCOUNTER — Other Ambulatory Visit: Payer: Self-pay

## 2020-09-22 ENCOUNTER — Other Ambulatory Visit: Payer: Medicaid Other

## 2020-09-22 ENCOUNTER — Other Ambulatory Visit: Payer: Self-pay | Admitting: Family Medicine

## 2020-09-22 DIAGNOSIS — Z206 Contact with and (suspected) exposure to human immunodeficiency virus [HIV]: Secondary | ICD-10-CM

## 2020-09-22 NOTE — Progress Notes (Signed)
Patient's husband had +HIV result in the ED. Patient and her husband will be tested for HIV Ab, Viral Load, as well as EBV panel to rule out EBV as cause of any symptoms.   Patient and husband to have labs drawn at [facility] today 09/22/2020.    [name], DO [facility], PGY-3 09/22/2020 2:07 PM

## 2020-09-22 NOTE — Addendum Note (Signed)
Addended by: [name] on: 09/22/2020 02:09 PM   Modules accepted: Orders

## 2020-09-23 ENCOUNTER — Encounter: Payer: Self-pay | Admitting: Family Medicine

## 2020-09-23 LAB — HIV ANTIBODY (ROUTINE TESTING W REFLEX): HIV Screen 4th Generation wRfx: NONREACTIVE

## 2020-09-23 NOTE — Telephone Encounter (Signed)
Telephone encounter  Ms. Brenda Kelley called the office asking that I call her back immediately.  I contacted the patient at number in the chart [phone].  Unfortunately her husband has tested positive for HIV and further testing has confirmed his HIV positive status.  This is a new diagnosis for him.  The patient and her husband were seen yesterday 4/21 for a lab visit in clinic and had HIV and EBV testing performed.  Ms. Bainter initial HIV antibody test has returned negative, which is reassuring.  Patient was instructed to abstain from sexual contact with her husband.  She would benefit from PrEP therapy.  ID was contacted regarding further work up prior to starting PrEP, will await response and treat as appropriate.   [name], DO [facility], PGY-3 09/23/2020 9:19 AM

## 2020-09-24 LAB — EPSTEIN-BARR VIRUS (EBV) ANTIBODY PROFILE
EBV NA IgG: 399 U/mL — ABNORMAL HIGH (ref 0.0–17.9)
EBV VCA IgG: 143 U/mL — ABNORMAL HIGH (ref 0.0–17.9)
EBV VCA IgM: <36 U/mL (ref 0.0–35.9)

## 2020-09-24 LAB — HIV-1 RNA QUANT-NO REFLEX-BLD: HIV-1 RNA Viral Load: <20 copies/mL

## 2020-09-25 ENCOUNTER — Encounter: Payer: Self-pay | Admitting: Family Medicine

## 2020-09-27 ENCOUNTER — Encounter: Payer: Self-pay | Admitting: Family Medicine

## 2020-10-09 ENCOUNTER — Encounter: Payer: Self-pay | Admitting: Family Medicine

## 2020-10-12 ENCOUNTER — Other Ambulatory Visit: Payer: Self-pay | Admitting: Family Medicine

## 2020-10-12 DIAGNOSIS — Z206 Contact with and (suspected) exposure to human immunodeficiency virus [HIV]: Secondary | ICD-10-CM

## 2020-10-14 ENCOUNTER — Telehealth: Payer: Self-pay

## 2020-10-14 ENCOUNTER — Other Ambulatory Visit (HOSPITAL_COMMUNITY): Payer: Self-pay

## 2020-10-14 NOTE — Telephone Encounter (Signed)
[facility] Patient Advocate Encounter  Insurance verification completed.    The patient is insured through [employer].  Patient have a high copay because he have not met his Deductible.  Biktarvy is Non-Formulary $3240.00 Dovato $2385.68  We will continue to follow to see if copay assistance is needed.  Donna Dean, CPhT Specialty Pharmacy Patient Advocate [facility] Phone: [phone] Fax:  [phone]  

## 2020-10-18 ENCOUNTER — Encounter: Payer: Self-pay | Admitting: Infectious Diseases

## 2020-10-18 ENCOUNTER — Other Ambulatory Visit (HOSPITAL_COMMUNITY)
Admission: RE | Admit: 2020-10-18 | Discharge: 2020-10-18 | Disposition: A | Discharge status: Home / Self Care | Payer: Medicaid Other | Source: Ambulatory Visit | Attending: Infectious Diseases | Admitting: Infectious Diseases

## 2020-10-18 ENCOUNTER — Ambulatory Visit (INDEPENDENT_AMBULATORY_CARE_PROVIDER_SITE_OTHER): Payer: Medicaid Other | Admitting: Infectious Diseases

## 2020-10-18 ENCOUNTER — Other Ambulatory Visit: Payer: Self-pay

## 2020-10-18 VITALS — BP 119/77 | HR 99 | Temp 98.3°F | Wt 264.0 lb

## 2020-10-18 DIAGNOSIS — Z202 Contact with and (suspected) exposure to infections with a predominantly sexual mode of transmission: Secondary | ICD-10-CM

## 2020-10-18 DIAGNOSIS — Z113 Encounter for screening for infections with a predominantly sexual mode of transmission: Secondary | ICD-10-CM | POA: Diagnosis not present

## 2020-10-18 DIAGNOSIS — Z7185 Encounter for immunization safety counseling: Secondary | ICD-10-CM

## 2020-10-18 NOTE — Patient Instructions (Signed)
How do you get HIV from someone who is infected?The most likely way to get HIV from someone who is infected is to: ?Have unprotected anal or vaginal sex with them ?Share the same needle, for example to inject drugs If you get blood or other body fluids in your vagina or anus, or inside the tip of your penis, you could get infected. It is also possible to get infected if blood or fluids with HIV get into your eyes or mouth. But this is less risky. It is very unlikely that you would get HIV just by getting blood or other body fluids on your skin.  Body fluids that are most dangerous include blood and the fluids that come from the penis, vagina, and anus during sex. Other body fluids are less dangerous, as long as they aren't mixed with blood. This includes urine, saliva, sweat, and tears. Can I lower my chances of getting HIV?Yes. The best ways to protect yourself are to use condoms any time you have sex, and avoid sharing needles. But there are also medicines that can lower your risk of getting HIV. Doctors call this "pre-exposure prophylaxis," or "PrEP." It involves 2 medicines that are combined in 1 pill you take once a day. PrEP is not recommended for everyone, but it can help people who are at high risk. Your level of risk depends on a few different things, including: ?Your sex partners, what kinds of sex you have, and whether you use condoms ?Whether you share needles for using drugs Your doctor or nurse can help you understand your risk. If you do end up taking PrEP medicines, they work very well to prevent HIV. But you will need to be sure to take the pill every day. Also, even with the medicines, there is always a small risk of getting HIV if you are exposed. That's why it is best to take these medicines in addition to using condoms. What will the doctor or nurse do?Before you make a decision about PrEP, your doctor or nurse will test you for HIV. This will show whether you were infected with the  virus in the past. If the test shows that you already have HIV, it is important to get started on the right treatment as soon as possible.  Your doctor or nurse will also ask you questions about your sex life and drug use. This is to get an idea of how likely you are to be at risk of getting HIV. They will probably ask questions about things you have done over the last 6 months, including: ?How many sex partners you have had and whether they are men, women, or both ?Whether any of your sex partners have HIV or are at risk for it ?If you have had vaginal or anal sex without a condom ?If you have had sex while using drugs ?If you or any of your partners use needles to inject drugs, and whether you ever share needles It can be hard or awkward to talk about these things. But it's important to be as honest as possible. This will help you and your doctor decide if it makes sense for you to start medicines to prevent HIV. Before your doctor prescribes medicine, you will get other tests, too. This is so your doctor can check for other problems that might need treatment. It is also to make sure your body can handle the medicines used to prevent HIV. Tests typically include: ?Tests for other infections you can get through sex  or by sharing needles ?Tests to check how well your kidneys are working ?A pregnancy test (for women) What are the risks or side effects of medicines to prevent HIV?Side effects of PrEP are usually mild. Some people have nausea or vomiting, but this usually gets better after the first 4 weeks or so. Sometimes, medicines to prevent HIV can damage the kidneys or bones. But this risk is very low. Your doctor will do regular tests to check how your kidneys are working. How long should I keep taking the medicines?You should take the PrEP medicine for as long as you continue to be at risk. During this time, you will need to see your doctor at least every few months. At these visits, he or she  will ask about any symptoms you have, and do a blood test to make sure you do not have HIV. You will also get other tests, depending on your situation. Over time, you might find you are no longer at high risk for getting HIV through sex or sharing needles. If this happens, you can talk with your doctor about stopping the medicine. What else should I know?The medicines work well to lower your chances of getting HIV, but there is always a small chance you could get it. For this reason, it is best to use condoms anytime you have sex, and avoid sharing needles. This is especially important during the first few weeks after you start PrEP medicines. That's because it takes some time for them to start working.  Condoms also help protect you from other infections you can get through sex. Although PrEP reduces your risk of getting HIV, it does not protect you from other diseases. You should also be sure to: ?Take your medicine exactly as your doctor tells you to. The medicine does not work as well if you miss doses. ?Go to all of your doctor's appointments. ?Tell your doctor if you notice any symptoms that could be related to HIV, such as fever, rash, swollen glands, and/or a sore throat. If you have questions or worries about HIV, your risks, or your medicine, talk to your doctor or nurse. They can help you.    Brand Names: [place]  Truvada  Brand Names: [place]  AG-Emtricitabine/Tenofovir;  APO-Emtricitabine-Tenofovir;  AURO-Emtricitabine-Tenofovir;  JAMP Emtricitabine/Tenofovir;  MYLAN-Emtricitabine/Tenofov DF;  PMS-Emtricitabine-Tenofovir;  TEVA-Emtricitabine/Tenofovir;  Truvada  Warning   For all patients taking this drug:   Hepatitis B has gotten worse when this drug was stopped in some people with hepatitis B. Close follow-up for a few months is needed when therapy is stopped in people who have hepatitis B. Do not stop taking this drug without calling your doctor.   Hepatitis B testing  needs to be done as you were told by the doctor. Talk with the doctor.   For HIV negative patients taking this drug to lower the chance of getting HIV through sex:   You must have a negative HIV test before taking this drug and at least every 3 months while you take it. Tell your doctor if you think you have been exposed to HIV. Call your doctor right away if you have a fever, headache, feel tired, joint or muscle aches, throwing up, diarrhea, sore throat, rash, night sweats, or swollen glands. Call your doctor right away if you had any of these signs within 1 month before you start taking this drug.  What is this drug used for?   It is used to treat HIV infection.   It is  used in HIV negative patients to lower the chance of getting HIV infection through sex.  What do I need to tell my doctor BEFORE I take this drug?   For all patients taking this drug:   If you are allergic to this drug; any part of this drug; or any other drugs, foods, or substances. Tell your doctor about the allergy and what signs you had.   If you have kidney disease or are on dialysis.   If you are taking another drug that has the same drug in it.   If you are taking any drugs that can raise the chance of kidney problems. There are many drugs that can do this. Ask your doctor or pharmacist if you are not sure.   If you are breast-feeding or plan to breast-feed.   For HIV negative patients taking this drug to lower the chance of getting HIV through sex:   If you have not been tested for HIV.   This is not a list of all drugs or health problems that interact with this drug.   Tell your doctor and pharmacist about all of your drugs (prescription or OTC, natural products, vitamins) and health problems. You must check to make sure that it is safe for you to take this drug with all of your drugs and health problems. Do not start, stop, or change the dose of any drug without checking with your doctor.  What are some things I need  to know or do while I take this drug?   For all patients taking this drug:   Tell all of your health care providers that you take this drug. This includes your doctors, nurses, pharmacists, and dentists.   Bone problems like bone pain, soft bones, and thin bones have happened. This may lead to broken bones. You may need to have a bone test.   Take calcium and vitamin D as you were told by your doctor.   Have blood work checked as you have been told by the doctor. Talk with the doctor.   Have your urine checked as you have been told by your doctor.   Kidney problems like kidney failure have happened with this drug. Tell your doctor if you have ever had kidney problems.   Rarely, this drug may cause a swollen liver and a buildup of acid in the blood. Sometimes, this may be deadly. The risk may be higher in females, in overweight people, and in people who have taken drugs like this one for a long time.   Tell your doctor if you are pregnant or plan on getting pregnant. You will need to talk about the benefits and risks of using this drug while you are pregnant.   For patients taking this drug to treat HIV infection:   This drug is not a cure for HIV. Stay under the care of your doctor.   This drug does not stop the spread of diseases like HIV or hepatitis that are passed through blood or having sex. Do not have any kind of sex without using a latex or polyurethane condom. Do not share needles or other things like toothbrushes or razors.   If giving this drug to your child and your child's weight changes, talk with the doctor. The dose of this drug may need to be changed.   For HIV negative patients taking this drug to lower the chance of getting HIV through sex:   This drug does not always prevent HIV. It  needs to be used as part of a program that has other measures to help prevent HIV. This includes safer sex habits like using condoms, testing for diseases passed by having sex, and talking with sex  partners who have HIV about their HIV treatment. Talk with the doctor for more information.   This drug does not stop the spread of other diseases like hepatitis that are passed through blood or having sex. Do not have any kind of sex without using a latex or polyurethane condom. Do not share needles or other things like toothbrushes or razors.  What are some side effects that I need to call my doctor about right away?   WARNING/CAUTION: Even though it may be rare, some people may have very bad and sometimes deadly side effects when taking a drug. Tell your doctor or get medical help right away if you have any of the following signs or symptoms that may be related to a very bad side effect:   For all patients taking this drug:   Signs of an allergic reaction, like rash; hives; itching; red, swollen, blistered, or peeling skin with or without fever; wheezing; tightness in the chest or throat; trouble breathing, swallowing, or talking; unusual hoarseness; or swelling of the mouth, face, lips, tongue, or throat.   Signs of kidney problems like unable to pass urine, change in how much urine is passed, blood in the urine, or a big weight gain.   Signs of liver problems like dark urine, feeling tired, not hungry, upset stomach or stomach pain, light-colored stools, throwing up, or yellow skin or eyes.   Signs of too much lactic acid in the blood (lactic acidosis) like fast breathing, fast heartbeat, a heartbeat that does not feel normal, very bad upset stomach or throwing up, feeling very sleepy, shortness of breath, feeling very tired or weak, very bad dizziness, feeling cold, or muscle pain or cramps.   Low mood (depression).   Bone pain.   Muscle pain or weakness.   Pain in arms or legs.   For patients taking this drug to treat HIV infection:   Changes in your immune system can happen when you start taking drugs to treat HIV. If you have an infection that you did not know you had, it may show up when you  take this drug. Tell your doctor right away if you have any new signs after you start this drug, even after taking it for several months. This includes signs of infection like fever, sore throat, weakness, cough, or shortness of breath.  What are some other side effects of this drug?   All drugs may cause side effects. However, many people have no side effects or only have minor side effects. Call your doctor or get medical help if any of these side effects or any other side effects bother you or do not go away:   For patients taking this drug to treat HIV infection:   Upset stomach.   Diarrhea.   Trouble sleeping.   Feeling dizzy, tired, or weak.   Headache.   Strange or odd dreams.   For HIV negative patients taking this drug to lower the chance of getting HIV through sex:   Headache.   Stomach pain.   Weight loss.   These are not all of the side effects that may occur. If you have questions about side effects, call your doctor. Call your doctor for medical advice about side effects.   You may report side effects  to your national health agency.  How is this drug best taken?   Use this drug as ordered by your doctor. Read all information given to you. Follow all instructions closely.   Take this drug at the same time of day.   Take with or without food.   Keep taking this drug as you have been told by your doctor or other health care provider, even if you feel well.   It is important that you do not miss or skip a dose of this drug during treatment.  What do I do if I miss a dose?   Take a missed dose as soon as you think about it.   If it is close to the time for your next dose, skip the missed dose and go back to your normal time.   Do not take 2 doses at the same time or extra doses.   If you are not sure what to do if you miss a dose, call your doctor.  How do I store and/or throw out this drug?   Store in the original container at room temperature.   Keep lid tightly closed.    Store in a dry place. Do not store in a bathroom.   Keep all drugs in a safe place. Keep all drugs out of the reach of children and pets.   Throw away unused or expired drugs. Do not flush down a toilet or pour down a drain unless you are told to do so. Check with your pharmacist if you have questions about the best way to throw out drugs. There may be drug take-back programs in your area.  General drug facts   If your symptoms or health problems do not get better or if they become worse, call your doctor.   Do not share your drugs with others and do not take anyone else's drugs.   Some drugs may have another patient information leaflet. If you have any questions about this drug, please talk with your doctor, nurse, pharmacist, or other health care provider.   If you think there has been an overdose, call your poison control center or get medical care right away. Be ready to tell or show what was taken, how much, and when it happened.

## 2020-10-18 NOTE — Progress Notes (Signed)
[facility]                                                             [address], [place], [place], [address]                                                                  Phn. [phone]; Fax: [phone]                                                                             Date: 10/18/20  Reason for Referral: HIV exposure  Requesting Provider: Peggyann Kelley  Assessment Problem List Items Addressed This Visit   None   Visit Diagnoses    Exposure to sexually transmitted disease (STD)    -  Primary   Relevant Orders   Urine cytology ancillary only   RPR   Immunization counseling       Screening for STDs (sexually transmitted diseases)         HIV Exposure 09/22/20 HIV fourth generation and HIV RNA is negative  PreP counseling  Immunization Counseling   Plan Discussed about PrEP and provided information about PreP to read  Urine GC and RPR Encouraged to get COVID booster  She will make an appointment if she decides to start prep  All questions and concerns were discussed and addressed. Patient verbalized understanding of the plan. ____________________________________________________________________________________________________________________  HPI: 40 year old female with PMH as below who is here for HIV exposure. She says her husband was tested positive for HIV on 09/18/20. She says she has been married with her current husband for 8 years. She is sexually active with her husband, mostly uses condoms but she had a unprotected sexual encounter with her husband 2 days ago prior to him being tested for HIV. She says her husband is not together with her. She is currently staying with her 48 year old son from previous marriage and her 53 year old son is with her husband. She is very tearful and will be seeing a therapist. She deneis any symptoms like fevers chills, sore  throat, rashes, lymph node swellings, malaise. Appetite and weight has been stable. Denies recent sickness, hospitalizations. Has been vaccinated for COVID with 2 doses. Discussed about getting a booster.   Denies smoking, drinks wine occasionally and denies using any drugs. She works in [employer] care.   ROS: Constitutional: Negative for fever, chills, activity change, appetite change, fatigue and unexpected weight change.  HENT: Negative for congestion, sore throat, rhinorrhea, sneezing, trouble swallowing and sinus pressure.  Eyes: Negative for photophobia and visual disturbance.  Respiratory: Negative for cough, chest tightness, shortness of breath, wheezing and stridor.  Cardiovascular: Negative for chest pain, palpitations and leg swelling.  Gastrointestinal: Negative for  nausea, vomiting, abdominal pain, diarrhea, constipation, blood in stool, abdominal distention and anal bleeding.  Genitourinary: Negative for dysuria, hematuria, flank pain and difficulty urinating.  Musculoskeletal: Negative for myalgias, back pain, joint swelling, arthralgias and gait problem.  Skin: Negative for color change, pallor, rash and wound.  Neurological: Negative for dizziness, tremors, weakness and light-headedness.  Hematological: Negative for adenopathy. Does not bruise/bleed easily.  Psychiatric/Behavioral: Negative for behavioral problems, confusion, sleep disturbance, dysphoric mood, decreased concentration and agitation.   Past Medical History:  Diagnosis Date  . Medical history non-contributory   . Sickle cell trait (HCC)   . Vaginal Pap smear, abnormal 2004   Past Surgical History:  Procedure Laterality Date  . CESAREAN SECTION     x 2  . CESAREAN SECTION WITH BILATERAL TUBAL LIGATION Bilateral 08/21/2013   Procedure: CESAREAN SECTION ;  Surgeon: Lesly Dukes, MD;  Location: [facility];  Service: Obstetrics;  Laterality: Bilateral;  Repeat  ; No bilateral tubal ligation performed  .  Keloid removal     Current Outpatient Medications on File Prior to Visit  Medication Sig Dispense Refill  . FLUoxetine (PROZAC) 20 MG capsule Take 1 capsule (20 mg total) by mouth daily. 30 capsule 1  . hydrOXYzine (ATARAX/VISTARIL) 10 MG tablet Take 1 tablet (10 mg total) by mouth 3 (three) times daily as needed. 30 tablet 1  . Multiple Vitamins-Minerals (MULTIVITAMIN WITH MINERALS) tablet Take 1 tablet by mouth daily.    . norethindrone (ORTHO MICRONOR) 0.35 MG tablet Take 1 tablet (0.35 mg total) by mouth daily. 28 tablet 11  . valACYclovir (VALTREX) 500 MG tablet TAKE 1 TABLET BY MOUTH TWICE DAILY FOR 5 DAYS WHEN SYMPTOMS BEGIN 90 tablet 3   No current facility-administered medications on file prior to visit.   No Known Allergies  Social History   Socioeconomic History  . Marital status: Legally Separated    Spouse name: Not on file  . Number of children: Not on file  . Years of education: Not on file  . Highest education level: Not on file  Occupational History  . Not on file  Tobacco Use  . Smoking status: Never Smoker  . Smokeless tobacco: Never Used  Substance and Sexual Activity  . Alcohol use: Yes    Comment: socially  . Drug use: No  . Sexual activity: Yes    Birth control/protection: Condom  Other Topics Concern  . Not on file  Social History Narrative   As of 03/01/2014    Education up to 11th grade.   Unemployed , looking for job   No driver's license. Thinking about it but scared of other drivers.    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   No exercise regularly.   No h/o tobacco use   No drug use   Social EtOH use.   Does not feel at risk for STD   Does feel safe in relationship.          Social Determinants of Health   Financial Resource Strain: Not on file  Food Insecurity: Not on file  Transportation Needs: Not on file   Physical Activity: Not on file  Stress: Not on file  Social Connections: Not on file  Intimate Partner Violence: Not on file    Vitals There were no vitals taken for this visit.   Examination  General - not in acute distress, comfortably sitting in chair HEENT - PEERLA, no pallor and no icterus Chest - b/l clear air entry, no additional sounds CVS- Normal s1s2, RRR Abdomen - Soft, Non tender , non distended Ext- no pedal edema Neuro: grossly normal Back - WNL Psych : calm and cooperative   Recent labs CBC Latest Ref Rng & Units 02/02/2014 08/22/2013 08/20/2013  WBC 4.0 - 10.5 K/uL 6.9 10.5 5.3  Hemoglobin 12.0 - 15.0 g/dL 39.7 6.7(H) 4.1(P)  Hematocrit 36.0 - 46.0 % 36.3 29.4(L) 29.3(L)  Platelets 150 - 400 K/uL 339 197 258   CMP Latest Ref Rng & Units 03/25/2019 02/02/2014  Glucose 65 - 99 mg/dL 91 379(K)  BUN 6 - 20 mg/dL 10 8  Creatinine 2.40 - 1.00 mg/dL 9.73 5.32  Sodium 992 - 144 mmol/L 145(H) 141  Potassium 3.5 - 5.2 mmol/L 4.6 4.5  Chloride 96 - 106 mmol/L 106 102  CO2 20 - 29 mmol/L 24 27  Calcium 8.7 - 10.2 mg/dL 9.3 9.5  Total Protein 6.0 - 8.3 g/dL - 8.0  Total Bilirubin 0.3 - 1.2 mg/dL - 4.2(A)  Alkaline Phos 39 - 117 U/L - 60  AST 0 - 37 U/L - 25  ALT 0 - 35 U/L - 10     Pertinent Microbiology Results for orders placed or performed during the hospital encounter of 08/06/17  Rapid strep screen     Status: None   Collection Time: 08/06/17 12:16 AM   Specimen: Other  Result Value Ref Range Status   Streptococcus, Group A Screen (Direct) NEGATIVE NEGATIVE Final    Comment: (NOTE) A Rapid Antigen test may result negative if the antigen level in the sample is below the detection level of this test. The FDA has not cleared this test as a stand-alone test therefore the rapid antigen negative result has reflexed to a Group A Strep culture. Performed at [facility], [address], [place], [place] [address]   Culture, group A strep     Status: None    Collection Time: 08/06/17 12:16 AM   Specimen: Throat  Result Value Ref Range Status   Specimen Description THROAT  Final   Special Requests   Final    NONE Reflexed from [id number] Performed at [facility], [address], [place], [place] [address]    Culture FEW STREPTOCOCCUS,BETA HEMOLYTIC NOT GROUP A  Final   Report Status 08/08/2017 FINAL  Final       Pertinent Imaging All pertinent labs/Imagings/notes reviewed. All pertinent plain films and CT images have been personally visualized and interpreted; radiology reports have been reviewed. Decision making incorporated into the Impression / Recommendations.  I have spent 60 minutes for this patient encounter including  review of prior medical records with greater than 50% of time in face to face counsel of the patient/discussing diagnostics and plan of care.   Electronically signed by:  [name], MD Infectious Disease Physician [facility] [address] [place], [place] [address] Phone: [phone]  Fax: [phone]

## 2020-10-19 ENCOUNTER — Telehealth: Payer: Self-pay

## 2020-10-19 LAB — URINE CYTOLOGY ANCILLARY ONLY
Chlamydia: NEGATIVE
Comment: NEGATIVE
Comment: NORMAL
Neisseria Gonorrhea: POSITIVE — AB

## 2020-10-19 LAB — RPR: RPR Ser Ql: NONREACTIVE

## 2020-10-19 NOTE — Telephone Encounter (Signed)
Patient informed of positive urine for gonorrhea. Patient is extremely upset given results but will be coming to clinic tomorrow for treatment.   Dr. Elinor Parkinson ordered 500mg  Ceftriaxone x1 IM injection.   Leina Babe , RN

## 2020-10-20 ENCOUNTER — Ambulatory Visit: Payer: Medicaid Other

## 2020-10-20 ENCOUNTER — Other Ambulatory Visit: Payer: Medicaid Other

## 2020-10-20 ENCOUNTER — Other Ambulatory Visit: Payer: Self-pay

## 2020-10-20 DIAGNOSIS — A549 Gonococcal infection, unspecified: Secondary | ICD-10-CM

## 2020-10-20 DIAGNOSIS — Z206 Contact with and (suspected) exposure to human immunodeficiency virus [HIV]: Secondary | ICD-10-CM

## 2020-10-20 MED ORDER — CEFTRIAXONE SODIUM 500 MG IJ SOLR: 500.0000 mg | Freq: Once | INTRAMUSCULAR | Status: DC

## 2020-10-20 NOTE — Progress Notes (Signed)
Patient came in for rocephin IM injection 500 mg. Patient tolerated injection well. Patient also informed again no sex for 10 days. Patient offered condoms today and she declined the need for condoms.  Katriona Schmierer T Pricilla Loveless

## 2020-10-22 LAB — HIV-1 RNA QUANT-NO REFLEX-BLD: HIV-1 RNA Viral Load: <20 copies/mL

## 2020-10-22 LAB — HIV ANTIBODY (ROUTINE TESTING W REFLEX): HIV Screen 4th Generation wRfx: NONREACTIVE

## 2020-11-17 ENCOUNTER — Ambulatory Visit (INDEPENDENT_AMBULATORY_CARE_PROVIDER_SITE_OTHER): Payer: Medicaid Other | Admitting: Clinical

## 2020-11-17 DIAGNOSIS — F411 Generalized anxiety disorder: Secondary | ICD-10-CM

## 2020-11-19 NOTE — Progress Notes (Signed)
   THERAPIST PROGRESS NOTE Virtual Visit via Video Note  I connected with Brenda Kelley on 11/17/2020 at  2:00 PM EDT by a video enabled telemedicine application and verified that I am speaking with the correct person using two identifiers.  Location: Patient: home Provider: office   I discussed the limitations of evaluation and management by telemedicine and the availability of in person appointments. The patient expressed understanding and agreed to proceed.   Follow Up Instructions: I discussed the assessment and treatment plan with the patient. The patient was provided an opportunity to ask questions and all were answered. The patient agreed with the plan and demonstrated an understanding of the instructions.   The patient was advised to call back or seek an in-person evaluation if the symptoms worsen or if the condition fails to improve as anticipated.   Session Time: 45 minutes  Participation Level: Active  Behavioral Response: CasualAlertEuthymic  Type of Therapy: Individual Therapy  Treatment Goals addressed: Coping  Interventions: CBT and Supportive  Summary:  Brenda Kelley is a 40 y.o. female who presents for the scheduled session oriented times ive, appropriately dressed, and friendly. Client denied hallucinations and delusions. Client reported on today she wants to re engage in therapy due to recent stressors that have occurred in the past couple of months. Client reported she has had increased anxiety because she and her husband of eights years split. Client reported her husband revealed that he cheated on her and contracted HIV. Client reported she has been on edge being around others, having crying spells, and she feels nervous to be happy. Client reported she has sheltered herself but her friends have been supportive in keeping her social. Client reported she has been cautious of others around her. Client reported her daughter has moved to live with her father and  she and her husband are coparenting effectively for their son. Client reported she has also changed jobs to [employer] and has been very happy with there transition.      Suicidal/Homicidal: Nowithout intent/plan  Therapist Response:  Therapist began the session asking the client about how she has been doing and updates. Therapist actively listened and provided positive emotional support while she discussed her thoughts and feelings. Therapist used CBT to engage and ask the client symptoms she has endorsed as a result of the recent stressor. Therapist used CBT to ask about the clients goal for therapy. Therapist used CBT assigned the client homework for engaging in positive self talk and calming exercises. Client will be scheduled for next appointment.     Plan: Return again in 5 weeks.  Diagnosis: Generalized anxiety disorder  Neena Rhymes Saundra Gin, LCSW 11/17/2020

## 2020-12-02 ENCOUNTER — Encounter: Payer: Self-pay | Admitting: Infectious Diseases

## 2020-12-02 ENCOUNTER — Other Ambulatory Visit: Payer: Self-pay

## 2020-12-02 ENCOUNTER — Telehealth (INDEPENDENT_AMBULATORY_CARE_PROVIDER_SITE_OTHER): Payer: Medicaid Other | Admitting: Infectious Diseases

## 2020-12-02 DIAGNOSIS — Z79899 Other long term (current) drug therapy: Secondary | ICD-10-CM | POA: Diagnosis not present

## 2020-12-02 NOTE — Progress Notes (Signed)
[facility]                                                             [address], [place], [place], [address]                                                                  Phn. [phone]; Fax: [phone]                                                                             Date: 10/18/20  Reason for Referral: HIV exposure  Requesting Provider: [name]  Assessment Problem List Items Addressed This Visit   None  HIV Exposure 09/22/20 HIV fourth generation and HIV RNA is negative  PreP counseling  Immunization Counseling   Plan Discussed about PrEP and provided information about PreP to read  Urine GC and RPR Encouraged to get COVID booster  She will make an appointment if she decides to start prep  All questions and concerns were discussed and addressed. Patient verbalized understanding of the plan. ____________________________________________________________________________________________________________________  HPI: 40 year old female with PMH as below who is here for HIV exposure. She says her husband was tested positive for HIV on 09/18/20. She says she has been married with her current husband for 8 years. She is sexually active with her husband, mostly uses condoms but she had a unprotected sexual encounter with her husband 2 days ago prior to him being tested for HIV. She says her husband is not together with her. She is currently staying with her 7 year old son from previous marriage and her 40 year old son is with her husband. She is very tearful and will be seeing a therapist. She deneis any symptoms like fevers chills, sore throat, rashes, lymph node swellings, malaise. Appetite and weight has been stable. Denies recent sickness, hospitalizations. Has been vaccinated for COVID with 2 doses. Discussed about getting a booster.   Denies smoking, drinks wine  occasionally and denies using any drugs. She works in [employer] care.    12/02/20 Has not been sexually active since last clinic visi. Denies any sexual partners. She is in good spirits, Working well.. She is going for a vacation in early July with her son. I discussed with her about PrEP. She has not considered about it but wanted me to send information about PrEP  ROS: Constitutional: Negative for fever, chills, activity change, appetite change, fatigue and unexpected weight change.  HENT: Negative for congestion, sore throat, rhinorrhea, sneezing, trouble swallowing and sinus pressure.  Eyes: Negative for photophobia and visual disturbance.  Respiratory: Negative for cough, chest tightness, shortness of breath, wheezing and stridor.  Cardiovascular: Negative for chest pain, palpitations and leg swelling.  Gastrointestinal: Negative for  nausea, vomiting, abdominal pain, diarrhea, constipation, blood in stool, abdominal distention and anal bleeding.  Genitourinary: Negative for dysuria, hematuria, flank pain and difficulty urinating.  Musculoskeletal: Negative for myalgias, back pain, joint swelling, arthralgias and gait problem.  Skin: Negative for color change, pallor, rash and wound.  Neurological: Negative for dizziness, tremors, weakness and light-headedness.  Hematological: Negative for adenopathy. Does not bruise/bleed easily.  Psychiatric/Behavioral: Negative for behavioral problems, confusion, sleep disturbance, dysphoric mood, decreased concentration and agitation.   Past Medical History:  Diagnosis Date   Medical history non-contributory    Sickle cell trait (HCC)    Vaginal Pap smear, abnormal 2004   Past Surgical History:  Procedure Laterality Date   CESAREAN SECTION     x 2   CESAREAN SECTION WITH BILATERAL TUBAL LIGATION Bilateral 08/21/2013   Procedure: CESAREAN SECTION ;  Surgeon: Lesly Dukes, MD;  Location: [facility];  Service: Obstetrics;  Laterality: Bilateral;   Repeat  ; No bilateral tubal ligation performed   Keloid removal     Current Outpatient Medications on File Prior to Visit  Medication Sig Dispense Refill   FLUoxetine (PROZAC) 20 MG capsule Take 1 capsule (20 mg total) by mouth daily. (Patient not taking: Reported on 10/18/2020) 30 capsule 1   hydrOXYzine (ATARAX/VISTARIL) 10 MG tablet Take 1 tablet (10 mg total) by mouth 3 (three) times daily as needed. (Patient not taking: Reported on 10/18/2020) 30 tablet 1   Multiple Vitamins-Minerals (MULTIVITAMIN WITH MINERALS) tablet Take 1 tablet by mouth daily. (Patient not taking: No sig reported)     norethindrone (ORTHO MICRONOR) 0.35 MG tablet Take 1 tablet (0.35 mg total) by mouth daily. (Patient not taking: No sig reported) 28 tablet 11   valACYclovir (VALTREX) 500 MG tablet TAKE 1 TABLET BY MOUTH TWICE DAILY FOR 5 DAYS WHEN SYMPTOMS BEGIN (Patient not taking: No sig reported) 90 tablet 3   Current Facility-Administered Medications on File Prior to Visit  Medication Dose Route Frequency Provider Last Rate Last Admin   cefTRIAXone (ROCEPHIN) injection 500 mg  500 mg Intramuscular Once [name], MD       Allergies  Allergen Reactions   Cefaclor Rash    Social History   Socioeconomic History   Marital status: Legally Separated    Spouse name: Not on file   Number of children: Not on file   Years of education: Not on file   Highest education level: Not on file  Occupational History   Not on file  Tobacco Use   Smoking status: Never   Smokeless tobacco: Never  Substance and Sexual Activity   Alcohol use: Yes    Comment: socially/wine   Drug use: No   Sexual activity: Yes    Birth control/protection: Condom  Other Topics Concern   Not on file  Social History Narrative   As of 03/01/2014    Education up to 11th grade.   Unemployed , looking for job   No driver's license. Thinking about it but scared of other drivers.    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   No exercise regularly.   No h/o tobacco use   No drug use   Social EtOH use.   Does not feel at risk for STD   Does feel safe in relationship.  Social Determinants of Health   Financial Resource Strain: Not on file  Food Insecurity: Not on file  Transportation Needs: Not on file  Physical Activity: Not on file  Stress: Not on file  Social Connections: Not on file  Intimate Partner Violence: Not on file    Vitals There were no vitals taken for this visit.   Examination  General - not in acute distress, comfortably sitting in chair HEENT - PEERLA, no pallor and no icterus Chest - b/l clear air entry, no additional sounds CVS- Normal s1s2, RRR Abdomen - Soft, Non tender , non distended Ext- no pedal edema Neuro: grossly normal Back - WNL Psych : calm and cooperative   Recent labs CBC Latest Ref Rng & Units 02/02/2014 08/22/2013 08/20/2013  WBC 4.0 - 10.5 K/uL 6.9 10.5 5.3  Hemoglobin 12.0 - 15.0 g/dL 82.8 0.0(L) 4.9(Z)  Hematocrit 36.0 - 46.0 % 36.3 29.4(L) 29.3(L)  Platelets 150 - 400 K/uL 339 197 258   CMP Latest Ref Rng & Units 03/25/2019 02/02/2014  Glucose 65 - 99 mg/dL 91 791(T)  BUN 6 - 20 mg/dL 10 8  Creatinine 0.56 - 1.00 mg/dL 9.79 4.80  Sodium 165 - 144 mmol/L 145(H) 141  Potassium 3.5 - 5.2 mmol/L 4.6 4.5  Chloride 96 - 106 mmol/L 106 102  CO2 20 - 29 mmol/L 24 27  Calcium 8.7 - 10.2 mg/dL 9.3 9.5  Total Protein 6.0 - 8.3 g/dL - 8.0  Total Bilirubin 0.3 - 1.2 mg/dL - 5.3(Z)  Alkaline Phos 39 - 117 U/L - 60  AST 0 - 37 U/L - 25  ALT 0 - 35 U/L - 10     Pertinent Microbiology Results for orders placed or performed during the hospital encounter of 08/06/17  Rapid strep screen     Status: None   Collection Time: 08/06/17 12:16 AM   Specimen: Other  Result Value Ref Range Status   Streptococcus, Group A Screen (Direct) NEGATIVE  NEGATIVE Final    Comment: (NOTE) A Rapid Antigen test may result negative if the antigen level in the sample is below the detection level of this test. The FDA has not cleared this test as a stand-alone test therefore the rapid antigen negative result has reflexed to a Group A Strep culture. Performed at [facility], [address], [place], [place] [address]   Culture, group A strep     Status: None   Collection Time: 08/06/17 12:16 AM   Specimen: Throat  Result Value Ref Range Status   Specimen Description THROAT  Final   Special Requests   Final    NONE Reflexed from [id number] Performed at [facility], [address] [place], [place] [address]    Culture FEW STREPTOCOCCUS,BETA HEMOLYTIC NOT GROUP A  Final   Report Status 08/08/2017 FINAL  Final       Pertinent Imaging All pertinent labs/Imagings/notes reviewed. All pertinent plain films and CT images have been personally visualized and interpreted; radiology reports have been reviewed. Decision making incorporated into the Impression / Recommendations.  I have spent 60 minutes for this patient encounter including  review of prior medical records with greater than 50% of time in face to face counsel of the patient/discussing diagnostics and plan of care.   Electronically signed by:  [name], MD Infectious Disease Physician [facility] [address] [place], [place] [address] Phone: [phone]  Fax: [phone]

## 2020-12-02 NOTE — Progress Notes (Signed)
Virtual Visit via Telephone Note  I connected withNAME@ on 12/02/20 at  9:45 AM EDT by a telephone enabled telemedicine application and verified that I am speaking with the correct person using two identifiers.  Location: Patient: home  Provider: [facility]   I discussed the limitations of evaluation and management by telemedicine and the availability of in person appointments. The patient expressed understanding and agreed to proceed.  [facility]  Patient Active Problem List   Diagnosis Date Noted   Need for immunization against influenza 08/16/2020   Encounter for hepatitis C screening test for low risk patient 08/16/2020   Routine screening for STI (sexually transmitted infection) 08/16/2020   Encounter for Papanicolaou smear for cervical cancer screening 08/15/2020   Screening for diabetes mellitus 07/19/2020   Well adult on routine health check 07/19/2020   No-show for appointment 07/19/2020   Moderate episode of recurrent major depressive disorder (HCC) 12/22/2019   Panic disorder 11/24/2019   Anxiety disorder 03/25/2019   Keloid 10/14/2017   Encounter for other general counseling or advice on contraception 09/15/2014   Health care maintenance 05/13/2014   Sickle cell trait (HCC) 03/02/2014   Obesity 03/01/2014   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   HYDROXYZINE (ATARAX/VISTARIL) 10 MG TABLET    Take 1 tablet (10 mg total) by mouth 3 (three) times daily as needed.   MULTIPLE VITAMINS-MINERALS (MULTIVITAMIN WITH MINERALS) TABLET    Take 1 tablet by mouth daily.   NORETHINDRONE (ORTHO MICRONOR) 0.35 MG TABLET    Take 1 tablet (0.35 mg total) by mouth daily.   VALACYCLOVIR (VALTREX) 500 MG TABLET    TAKE 1 TABLET BY MOUTH TWICE DAILY FOR 5 DAYS WHEN SYMPTOMS BEGIN   Modified Medications   No medications on file  Discontinued Medications   No medications on file    History of Present Illness: Here for follow up for discussion of PreP. She says she has not been sexually active since last clinic visit. Denies having any sexual partners. She is a good emotional space and feels good. She is working and loves her work. She is also going for a vacation in [place] with her son and her neighbor. Discussed with her about PrEP. She would like to think about it and asked me to send her something to read about PrEP. No other complaints.   ROS 12 point ROS done with pertinent positives and negatives listed above   Past Medical History:  Diagnosis Date   Medical history non-contributory    Sickle cell trait (HCC)    Vaginal Pap smear, abnormal 2004    Social History   Tobacco Use   Smoking status: Never   Smokeless tobacco: Never  Substance Use Topics   Alcohol use: Yes    Comment: socially/wine   Drug use: No    Family History  Problem Relation Age of Onset   Heart disease Mother    Hypertension Mother    Alcohol abuse Mother    Stroke Mother    Cancer Maternal Aunt 64       cancer    Allergies  Allergen Reactions   Cefaclor Rash    Health Maintenance  Topic Date Due   COVID-19 Vaccine (1) Never done   Pneumococcal Vaccine 14-76 Years old (  1 - PCV) Never done   INFLUENZA VACCINE  01/02/2021   TETANUS/TDAP  08/12/2023   PAP SMEAR-Modifier  08/17/2023   Hepatitis C Screening  Completed   HIV Screening  Completed   HPV VACCINES  Aged Out    Observations/Objective: Able to speak fluently   Assessment and Plan: PrEP Counseled on PrEP  She would like to consider it and will call to make an appointment if she would like to be on PrEP   Follow Up Instructions: As needed   I discussed the assessment and treatment plan with the patient. The patient was provided an opportunity to ask questions and all were answered. The patient agreed  with the plan and demonstrated an understanding of the instructions.   The patient was advised to call back or seek an in-person evaluation if the symptoms worsen or if the condition fails to improve as anticipated.  I provided 20 minutes of non-face-to-face time during this encounter.  Victoriano Lain, MD [facility] Phone [phone] Fax no. [phone]  12/02/2020, 12:54 PM

## 2020-12-19 ENCOUNTER — Encounter: Payer: Self-pay | Admitting: Family Medicine

## 2021-01-10 ENCOUNTER — Telehealth: Payer: Self-pay | Admitting: Family Medicine

## 2021-01-10 NOTE — Telephone Encounter (Signed)
Patient is calling and would like to have a new referral placed for a therapist. She is currently seen at [facility] but does not feel like this is the best option for her. She said she has issues with scheduling with them.   Please call patient with any questions or to let her know when the new referral has been placed.   The best call back number is [phone].

## 2021-01-16 NOTE — Telephone Encounter (Signed)
Will forward to MD to place a CCM referral- managed medicaid.  The careguides can help her find a better therapy option.  Maricela Schreur,CMA

## 2021-01-17 ENCOUNTER — Other Ambulatory Visit: Payer: Self-pay | Admitting: Family Medicine

## 2021-01-17 DIAGNOSIS — F411 Generalized anxiety disorder: Secondary | ICD-10-CM

## 2021-01-17 NOTE — Telephone Encounter (Signed)
CCM referral placed for therapy resources.   [name], DO

## 2021-01-20 ENCOUNTER — Ambulatory Visit (INDEPENDENT_AMBULATORY_CARE_PROVIDER_SITE_OTHER): Payer: Medicaid Other | Admitting: Clinical

## 2021-01-20 DIAGNOSIS — F411 Generalized anxiety disorder: Secondary | ICD-10-CM | POA: Diagnosis not present

## 2021-01-24 ENCOUNTER — Other Ambulatory Visit: Payer: Self-pay | Admitting: Licensed Clinical Social Worker

## 2021-01-24 NOTE — Patient Outreach (Signed)
Medicaid Managed Care Social Work Note  01/24/2021 Name:  Brenda Kelley MRN:  [id number] DOB:  May 25, 1981  Brenda Kelley is an 40 y.o. year old female who is a primary patient of [name], DO.  The Medicaid Managed Care Coordination team was consulted for assistance with:  Mental Health Counseling and Resources  Brenda Kelley was given information about Medicaid Managed Care Coordination team services today. Brenda Kelley Patient agreed to services and verbal consent obtained.  Engaged with patient  for by telephone forinitial visit in response to referral for case management and/or care coordination services.   Assessments/Interventions:  Review of past medical history, allergies, medications, health status, including review of consultants reports, laboratory and other test data, was performed as part of comprehensive evaluation and provision of chronic care management services.  SDOH: (Social Determinant of Health) assessments and interventions performed: SDOH Interventions    Flowsheet Row Most Recent Value  SDOH Interventions   SDOH Interventions for the Following Domains Depression, Stress  Stress Interventions Provide Counseling, Offered Community Wellness Resources  Depression Interventions/Treatment  Counseling  [Referral to [place] on 01/24/21]       Advanced Directives Status:  See Care Plan for related entries.  Care Plan                 Allergies  Allergen Reactions   Cefaclor Rash    Medications Reviewed Today     Reviewed by Sandie Ano, RN (Registered Nurse) on 12/02/20 at [phone]  Med List Status: <None>   Medication Order Taking? Sig Documenting Provider Last Dose Status Informant  cefTRIAXone (ROCEPHIN) injection 500 mg [id number]   [name], MD  Active   FLUoxetine (PROZAC) 20 MG capsule [id number]  Take 1 capsule (20 mg total) by mouth daily.  Patient not taking: Reported on 10/18/2020   [name], NP  Active   hydrOXYzine  (ATARAX/VISTARIL) 10 MG tablet [id number]  Take 1 tablet (10 mg total) by mouth 3 (three) times daily as needed.  Patient not taking: Reported on 10/18/2020   [name], NP  Active   Multiple Vitamins-Minerals (MULTIVITAMIN WITH MINERALS) tablet [id number] No Take 1 tablet by mouth daily.  Patient not taking: No sig reported   [provider] Not Taking Active   norethindrone (ORTHO MICRONOR) 0.35 MG tablet [id number] No Take 1 tablet (0.35 mg total) by mouth daily.  Patient not taking: No sig reported   [name], DO Not Taking Active   valACYclovir (VALTREX) 500 MG tablet [id number] No TAKE 1 TABLET BY MOUTH TWICE DAILY FOR 5 DAYS WHEN SYMPTOMS BEGIN  Patient not taking: No sig reported   [name], DO Not Taking Active             Patient Active Problem List   Diagnosis Date Noted   Need for immunization against influenza 08/16/2020   Encounter for hepatitis C screening test for low risk patient 08/16/2020   Routine screening for STI (sexually transmitted infection) 08/16/2020   Encounter for Papanicolaou smear for cervical cancer screening 08/15/2020   Screening for diabetes mellitus 07/19/2020   Well adult on routine health check 07/19/2020   No-show for appointment 07/19/2020   Moderate episode of recurrent major depressive disorder (HCC) 12/22/2019   Panic disorder 11/24/2019   Anxiety disorder 03/25/2019   Keloid 10/14/2017   Encounter for other general counseling or advice on contraception 09/15/2014   Health care maintenance 05/13/2014   Sickle cell trait (HCC) 03/02/2014  Obesity 03/01/2014   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    Conditions to be addressed/monitored per PCP order:  Anxiety and Depression  Care Plan : General Social Work (Adult)  Updates made by [name], LCSW since 01/24/2021 12:00 AM     Problem: Anxiety Identification (Anxiety)      Long-Range Goal: I want to find a new long term  counselor   Start Date: 01/24/2021  Note:   Timeframe:  Long-Range Goal Priority:  High Start Date:    01/24/21                       Expected End Date:  ongoing                     Follow Up Date 02/01/21  Current barriers:   Chronic Mental Health needs related to depression and anxiety Limited social support and Mental Health Concerns  Needs Support, Education, and Care Coordination in order to meet unmet mental health needs. Clinical Goal(s): demonstrate a reduction in symptoms related to :Anxiety  and Depression  explore community resource options for unmet needs related to:No needs identified during this visit  patient will work with SW to address concerns related to finding a long term counselor   Clinical Interventions:  Patient is discontinued mental health counseling at [facility] because she was not able to see her counselor but every few months. Patient is interested in seeing a counselor weekly or bi-weekly. Referral made for [facility] with patient's permission on 01/24/21. Inter-disciplinary care team collaboration (see longitudinal plan of care) Assessed patient's previous and current treatment, coping skills, support system and barriers to care  Review various resources, discussed options and provided patient information about Options for mental health treatment based on need and insurance Depression screen reviewed , PHQ2/ PHQ9 completed, Solution-Focused Strategies, Mindfulness or Relaxation Training, Active listening / Reflection utilized , Emotional Supportive Provided, Behavioral Activation, Provided brief CBT , Participation in counseling encouraged , Suicidal Ideation/Homicidal Ideation assessed:, Discussed Health Care Power of Attorney , Discussed referral to [facility] to assist with connecting to mental health provider, and Made referral to [facility] on 01/24/21  ; Patient Goals/Self-Care Activities: Over the next 120 days - self-awareness of emotional triggers encouraged -  strategies to manage emotional triggers promoted - suicide risk screen reviewed - avoid negative self-talk - develop a personal safety plan - develop a plan to deal with triggers like holidays, anniversaries - exercise at least 2 to 3 times per week - have a plan for how to handle bad days - journal feelings and what helps to feel better or worse - spend time or talk with others at least 2 to 3 times per week - spend time or talk with others every day - watch for early signs of feeling worse - begin personal counseling - call and visit an old friend - check out volunteer opportunities - join a support group - laugh; watch a funny movie or comedian - learn and use visualization or guided imagery - perform a random act of kindness - practice relaxation or meditation daily - start or continue a personal journal - talk about feelings with a friend, family or spiritual advisor - practice positive thinking and self-talk I have placed a referral with [facility] to assist with connecting you with a mental health provider. they will contact you once a provider is located.  Depression screen [facility] 2/9 01/24/2021 12/02/2020 10/18/2020 08/16/2020 10/30/2019  Decreased  Interest 1 0 0 0 1  Down, Depressed, Hopeless 1 0 0 0 2  PHQ - 2 Score 2 0 0 0 3  Altered sleeping 0 - - 0 -  Tired, decreased energy 1 - - 0 -  Change in appetite 0 - - 0 -  Feeling bad or failure about yourself  0 - - 0 -  Trouble concentrating 0 - - 0 -  Moving slowly or fidgety/restless 0 - - 0 -  Suicidal thoughts 0 - - 0 -  PHQ-9 Score 3 - - 0 -  Difficult doing work/chores Somewhat difficult - - Not difficult at all -       Follow up:  Patient agrees to Care Plan and Follow-up.  Plan: The Managed Medicaid care management team will reach out to the patient again over the next 30 days.  Date of next scheduled Social Work care management/care coordination outreach:  02/01/21  Dickie La, BSW, MSW, LCSW Managed Medicaid  LCSW [facility]  [facility] [email or web address] Phone: [phone]

## 2021-01-24 NOTE — Patient Instructions (Signed)
Visit Information  Ms. Ransdell was given information about Medicaid Managed Care team care coordination services as a part of their Healthy Blue Medicaid benefit. Arman Filter Whalin verbally consented to engagement with the [facility] Managed Care team.   If you are experiencing a medical emergency, please call 911 or report to your local emergency department or urgent care.   If you have a non-emergency medical problem during routine business hours, please contact your provider's office and ask to speak with a nurse.   For questions related to your Healthy [facility] health plan, please call: [phone] or visit the homepage here: [email or web address]  If you would like to schedule transportation through your Healthy [facility] plan, please call the following number at least 2 days in advance of your appointment: [phone]  Call the [facility] Crisis Line at [phone], at any time, 24 hours a day, 7 days a week. If you are in danger or need immediate medical attention call 911.  If you would like help to quit smoking, call 1-800-QUIT-NOW (857-340-6358) OR Espaol: 1-855-Djelo-Ya (4-332-951-8841) o para ms informacin haga clic aqu or Text READY to 660-630 to register via text  Ms. Plancarte - following are the goals we discussed in your visit today:   Goals Addressed             This Visit's Progress    Manage My Emotions       Timeframe:  Long-Range Goal Priority:  High Start Date:    01/24/21                       Expected End Date:  ongoing                     Follow Up Date 02/01/21   - begin personal counseling - call and visit an old friend - check out volunteer opportunities - join a support group - laugh; watch a funny movie or comedian - learn and use visualization or guided imagery - perform a random act of kindness - practice relaxation or meditation daily - start or continue a personal journal - talk about feelings  with a friend, family or spiritual advisor - practice positive thinking and self-talk    Why is this important?   When you are stressed, down or upset, your body reacts too.  For example, your blood pressure may get higher; you may have a headache or stomachache.  When your emotions get the best of you, your body's ability to fight off cold and flu gets weak.  These steps will help you manage your emotions.     Notes:        Dickie La, BSW, MSW, [employer] Managed Medicaid LCSW [facility]  [facility] [email or web address] Phone: [phone]

## 2021-01-28 NOTE — Progress Notes (Signed)
   THERAPIST PROGRESS NOTE Virtual Visit via Video Note  I connected with Jeanita A Corney on 01/20/2021 at  8:00 AM EDT by a video enabled telemedicine application and verified that I am speaking with the correct person using two identifiers.  Location: Patient: home Provider: office   I discussed the limitations of evaluation and management by telemedicine and the availability of in person appointments. The patient expressed understanding and agreed to proceed.   Follow Up Instructions: I discussed the assessment and treatment plan with the patient. The patient was provided an opportunity to ask questions and all were answered. The patient agreed with the plan and demonstrated an understanding of the instructions.   The patient was advised to call back or seek an in-person evaluation if the symptoms worsen or if the condition fails to improve as anticipated.   Session Time: 30 minutes  Participation Level: Active  Behavioral Response: CasualAlertEuthymic  Type of Therapy: Individual Therapy  Treatment Goals addressed: Coping  Interventions: CBT and Supportive  Summary:  GERALYN FIGIEL is a 40 y.o. female who presents for scheduled appointment oriented x5, appropriately dressed, and friendly.  Client denied hallucinations and delusions. Client reported on today she is doing fairly well.  Client reported since she was last seen she has continued to work.  Client reported she has also been keeping herself interactive and her interpersonal relationships and taking trips to prevent herself from being isolated.  Client reported she has good and bad days.  Client reported her friend has been very supportive of her throughout her process of separating from her husband.  Client reported she has days when she feels in distress and is hard for her to cope with her emotions.    Suicidal/Homicidal: Nowithout intent/plan  Therapist Response:  Therapist used CBT to ask open-ended questions  about how she has been doing since last seen. Therapist used CBT for active listening and positive emotional support. Therapist used CBT to ask the client open-ended questions about symptoms that she continues to struggle with. Therapist engaged with the client about discussing other outpatient options documenting her need for frequency of sessions. Therapist addressed questions and concerns. Therapist provided the client with referral information.     Plan: Therapist provided client with information to the center of emotional wellness for inquiry of outpatient therapy.   Diagnosis: Generalized anxiety disorder  Neena Rhymes Jake Fuhrmann, LCSW 01/20/2021

## 2021-02-01 ENCOUNTER — Other Ambulatory Visit: Payer: Self-pay | Admitting: Licensed Clinical Social Worker

## 2021-02-01 NOTE — Patient Instructions (Signed)
[name] ,   The [facility] Managed Care Team is available to provide assistance to you with your healthcare needs at no cost and as a benefit of your [facility] plan. I'm sorry we were unable to complete our scheduled appointment. Our care guide will call you to reschedule our telephone appointment. Please call me at the number below. I am available to be of assistance to you regarding your healthcare needs. .   Thank you,  Dickie La, BSW, MSW, LCSW Managed Medicaid LCSW [facility]  [email or web address] Phone: [phone]

## 2021-02-01 NOTE — Patient Outreach (Signed)
  [facility]) Care Management  [facility] Community Care Manager  02/01/2021   Brenda Kelley [birth year] [id number]  Encounter Medications:  Outpatient Encounter Medications as of 02/01/2021  Medication Sig   FLUoxetine (PROZAC) 20 MG capsule Take 1 capsule (20 mg total) by mouth daily. (Patient not taking: Reported on 10/18/2020)   hydrOXYzine (ATARAX/VISTARIL) 10 MG tablet Take 1 tablet (10 mg total) by mouth 3 (three) times daily as needed. (Patient not taking: Reported on 10/18/2020)   Multiple Vitamins-Minerals (MULTIVITAMIN WITH MINERALS) tablet Take 1 tablet by mouth daily. (Patient not taking: No sig reported)   norethindrone (ORTHO MICRONOR) 0.35 MG tablet Take 1 tablet (0.35 mg total) by mouth daily. (Patient not taking: No sig reported)   valACYclovir (VALTREX) 500 MG tablet TAKE 1 TABLET BY MOUTH TWICE DAILY FOR 5 DAYS WHEN SYMPTOMS BEGIN (Patient not taking: No sig reported)   Facility-Administered Encounter Medications as of 02/01/2021  Medication   cefTRIAXone (ROCEPHIN) injection 500 mg    Functional Status:  No flowsheet data found.  Fall/Depression Screening: Fall Risk  12/02/2020 10/18/2020 10/30/2019  Falls in the past year? 0 0 0  Number falls in past yr: - - 0  Risk for fall due to : No Fall Risks - -  Follow up Falls evaluation completed - Falls evaluation completed   PHQ 2/9 Scores 01/24/2021 12/02/2020 10/18/2020 08/16/2020 10/30/2019 03/25/2019 10/14/2017  PHQ - 2 Score 2 0 0 0 3 0 0  PHQ- 9 Score 3 - - 0 - - -    Assessment:   Care Plan There are no care plans that you recently modified to display for this patient.    Goals Addressed   None    LCSW completed [facility] outreach attempt today and was able to briefly reach patient successfully. A HIPPA compliant voice message was left earlier encouraging patient to return call but [facility] LCSW was able to reach her later on in the day. Patient reports that she is unable to talk right now as she is very busy at work.   LCSW will ask Scheduling Care Guide to reschedule [facility] SW appointment with patient as well.  Dickie La, BSW, MSW, [employer] Managed Medicaid LCSW [facility]  [facility] [email or web address] Phone: [phone]

## 2021-02-02 DIAGNOSIS — F418 Other specified anxiety disorders: Secondary | ICD-10-CM | POA: Diagnosis not present

## 2021-02-07 ENCOUNTER — Other Ambulatory Visit: Payer: Self-pay | Admitting: Licensed Clinical Social Worker

## 2021-02-07 NOTE — Patient Instructions (Signed)
Visit Information  Ms. Everson was given information about Medicaid Managed Care team care coordination services as a part of their Healthy Blue Medicaid benefit. Arman Filter Knapik verbally consented to engagement with the [facility] Managed Care team.   If you are experiencing a medical emergency, please call 911 or report to your local emergency department or urgent care.   If you have a non-emergency medical problem during routine business hours, please contact your provider's office and ask to speak with a nurse.   For questions related to your Healthy [facility] health plan, please call: [phone] or visit the homepage here: [email or web address]  If you would like to schedule transportation through your Healthy [facility] plan, please call the following number at least 2 days in advance of your appointment: [phone]  Call the [facility] Crisis Line at [phone], at any time, 24 hours a day, 7 days a week. If you are in danger or need immediate medical attention call 911.  If you would like help to quit smoking, call 1-800-QUIT-NOW ((949) 049-4465) OR Espaol: 1-855-Djelo-Ya (5-638-756-4332) o para ms informacin haga clic aqu or Text READY to 951-884 to register via text  Ms. Hukill - following are the goals we discussed in your visit today:   Goals Addressed             This Visit's Progress    Manage My Emotions       Timeframe:  Long-Range Goal Priority:  High Start Date:    01/24/21                       Expected End Date:  ongoing                     Follow Up Date 02/21/21   - begin personal counseling - call and visit an old friend - check out volunteer opportunities - join a support group - laugh; watch a funny movie or comedian - learn and use visualization or guided imagery - perform a random act of kindness - practice relaxation or meditation daily - start or continue a personal journal - talk about feelings  with a friend, family or spiritual advisor - practice positive thinking and self-talk    Why is this important?   When you are stressed, down or upset, your body reacts too.  For example, your blood pressure may get higher; you may have a headache or stomachache.  When your emotions get the best of you, your body's ability to fight off cold and flu gets weak.  These steps will help you manage your emotions.     Notes:        Dickie La, BSW, MSW, [employer] Managed Medicaid LCSW [facility]  [email or web address] Phone: [phone]

## 2021-02-07 NOTE — Patient Outreach (Signed)
Medicaid Managed Care Social Work Note  02/07/2021 Name:  Brenda Kelley MRN:  [id number] DOB:  [birth year]  Brenda Kelley is an 40 y.o. year old female who is a primary patient of [name], DO.  The Medicaid Managed Care Coordination team was consulted for assistance with:  Mental Health Counseling and Resources  Ms. Drollinger was given information about Medicaid Managed Care Coordination team services today. [name] Patient agreed to services and verbal consent obtained.  Engaged with patient  for by telephone forfollow up visit in response to referral for case management and/or care coordination services.   Assessments/Interventions:  Review of past medical history, allergies, medications, health status, including review of consultants reports, laboratory and other test data, was performed as part of comprehensive evaluation and provision of chronic care management services.  SDOH: (Social Determinant of Health) assessments and interventions performed: SDOH Interventions    Flowsheet Row Most Recent Value  SDOH Interventions   Stress Interventions Provide Counseling  Depression Interventions/Treatment  Counseling       Advanced Directives Status:  See Care Plan for related entries.  Care Plan                 Allergies  Allergen Reactions   Cefaclor Rash    Medications Reviewed Today     Reviewed by Sandie Ano, RN (Registered Nurse) on 12/02/20 at [phone]  Med List Status: <None>   Medication Order Taking? Sig Documenting Provider Last Dose Status Informant  cefTRIAXone (ROCEPHIN) injection 500 mg [id number]   [name], MD  Active   FLUoxetine (PROZAC) 20 MG capsule [id number]  Take 1 capsule (20 mg total) by mouth daily.  Patient not taking: Reported on 10/18/2020   [name], NP  Active   hydrOXYzine (ATARAX/VISTARIL) 10 MG tablet [id number]  Take 1 tablet (10 mg total) by mouth 3 (three) times daily as needed.  Patient not taking: Reported  on 10/18/2020   [name], NP  Active   Multiple Vitamins-Minerals (MULTIVITAMIN WITH MINERALS) tablet [id number] No Take 1 tablet by mouth daily.  Patient not taking: No sig reported   [provider] Not Taking Active   norethindrone (ORTHO MICRONOR) 0.35 MG tablet [id number] No Take 1 tablet (0.35 mg total) by mouth daily.  Patient not taking: No sig reported   [name], DO Not Taking Active   valACYclovir (VALTREX) 500 MG tablet [id number] No TAKE 1 TABLET BY MOUTH TWICE DAILY FOR 5 DAYS WHEN SYMPTOMS BEGIN  Patient not taking: No sig reported   [name], DO Not Taking Active             Patient Active Problem List   Diagnosis Date Noted   Need for immunization against influenza 08/16/2020   Encounter for hepatitis C screening test for low risk patient 08/16/2020   Routine screening for STI (sexually transmitted infection) 08/16/2020   Encounter for Papanicolaou smear for cervical cancer screening 08/15/2020   Screening for diabetes mellitus 07/19/2020   Well adult on routine health check 07/19/2020   No-show for appointment 07/19/2020   Moderate episode of recurrent major depressive disorder (HCC) 12/22/2019   Panic disorder 11/24/2019   Anxiety disorder 03/25/2019   Keloid 10/14/2017   Encounter for other general counseling or advice on contraception 09/15/2014   Health care maintenance 05/13/2014   Sickle cell trait (HCC) 03/02/2014   Obesity 03/01/2014   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    Conditions to  be addressed/monitored per PCP order:  Anxiety and Depression  Care Plan : General Social Work (Adult)  Updates made by [name], LCSW since 02/07/2021 12:00 AM     Problem: Anxiety Identification (Anxiety)      Long-Range Goal: I want to find a new long term counselor   Start Date: 01/24/2021  Note:   Timeframe:  Long-Range Goal Priority:  High Start Date:    01/24/21                       Expected  End Date:  ongoing                     Follow Up Date 02/21/21  Current barriers:   Chronic Mental Health needs related to depression and anxiety Limited social support and Mental Health Concerns  Needs Support, Education, and Care Coordination in order to meet unmet mental health needs. Clinical Goal(s): demonstrate a reduction in symptoms related to :Anxiety  and Depression  explore community resource options for unmet needs related to:No needs identified during this visit  patient will work with SW to address concerns related to finding a long term counselor   Clinical Interventions:  Patient is discontinued mental health counseling at [facility] because she was not able to see her counselor but every few months. Patient is interested in seeing a counselor weekly or bi-weekly. Referral made for [facility] with patient's permission on 01/24/21.  Update- [name] has been unable to reach patient and patient is agreeable to contact their customer service number today on 02/07/21 to inquire. Inter-disciplinary care team collaboration (see longitudinal plan of care) Assessed patient's previous and current treatment, coping skills, support system and barriers to care  Review various resources, discussed options and provided patient information about Options for mental health treatment based on need and insurance Depression screen reviewed , PHQ2/ PHQ9 completed, Solution-Focused Strategies, Mindfulness or Relaxation Training, Active listening / Reflection utilized , Emotional Supportive Provided, Behavioral Activation, Provided brief CBT , Participation in counseling encouraged , Suicidal Ideation/Homicidal Ideation assessed:, Discussed Health Care Power of Attorney , Discussed referral to [facility] to assist with connecting to mental health provider, and Made referral to [facility] on 01/24/21  ; Patient Goals/Self-Care Activities: Over the next 120 days - self-awareness of emotional triggers encouraged - strategies  to manage emotional triggers promoted - suicide risk screen reviewed - avoid negative self-talk - develop a personal safety plan - develop a plan to deal with triggers like holidays, anniversaries - exercise at least 2 to 3 times per week - have a plan for how to handle bad days - journal feelings and what helps to feel better or worse - spend time or talk with others at least 2 to 3 times per week - spend time or talk with others every day - watch for early signs of feeling worse - begin personal counseling - call and visit an old friend - check out volunteer opportunities - join a support group - laugh; watch a funny movie or comedian - learn and use visualization or guided imagery - perform a random act of kindness - practice relaxation or meditation daily - start or continue a personal journal - talk about feelings with a friend, family or spiritual advisor - practice positive thinking and self-talk I have placed a referral with [facility] to assist with connecting you with a mental health provider. they will contact you once a provider is located.  Depression screen PHQ  2/9 01/24/2021 12/02/2020 10/18/2020 08/16/2020 10/30/2019  Decreased Interest 1 0 0 0 1  Down, Depressed, Hopeless 1 0 0 0 2  PHQ - 2 Score 2 0 0 0 3  Altered sleeping 0 - - 0 -  Tired, decreased energy 1 - - 0 -  Change in appetite 0 - - 0 -  Feeling bad or failure about yourself  0 - - 0 -  Trouble concentrating 0 - - 0 -  Moving slowly or fidgety/restless 0 - - 0 -  Suicidal thoughts 0 - - 0 -  PHQ-9 Score 3 - - 0 -  Difficult doing work/chores Somewhat difficult - - Not difficult at all -       Follow up:  Patient agrees to Care Plan and Follow-up.  Plan: The Managed Medicaid care management team will reach out to the patient again over the next 30 days.  Date/time of next scheduled Social Work care management/care coordination outreach:  02/21/21  Dickie La, BSW, MSW, LCSW Managed Medicaid  LCSW [facility]  [facility] [email or web address] Phone: [phone]

## 2021-02-09 ENCOUNTER — Encounter: Payer: Self-pay | Admitting: Family Medicine

## 2021-02-10 ENCOUNTER — Ambulatory Visit: Payer: Medicaid Other

## 2021-02-21 ENCOUNTER — Other Ambulatory Visit: Payer: Self-pay | Admitting: Licensed Clinical Social Worker

## 2021-02-21 NOTE — Patient Outreach (Signed)
Medicaid Managed Care Social Work Note  02/21/2021 Name:  Brenda Kelley MRN:  [id number] DOB:  09/19/1980  Arman Kelley Brenda is an 40 y.o. year old female who is a primary patient of Brenda Cabal, DO.  The Medicaid Managed Care Coordination team was consulted for assistance with:  Mental Health Counseling and Resources  Brenda Kelley was given information about Medicaid Managed Care Coordination team services today. Brenda Kelley Patient agreed to services and verbal consent obtained.  Engaged with patient  for by telephone forfollow up visit in response to referral for case management and/or care coordination services.   Assessments/Interventions:  Review of past medical history, allergies, medications, health status, including review of consultants reports, laboratory and other test data, was performed as part of comprehensive evaluation and provision of chronic care management services.  SDOH: (Social Determinant of Health) assessments and interventions performed: SDOH Interventions    Flowsheet Row Most Recent Value  SDOH Interventions   Stress Interventions Provide Counseling  Depression Interventions/Treatment  Counseling       Advanced Directives Status:  See Care Plan for related entries.  Care Plan                 Allergies  Allergen Reactions   Cefaclor Rash    Medications Reviewed Today     Reviewed by Sandie Ano, RN (Registered Nurse) on 12/02/20 at [phone]  Med List Status: <None>   Medication Order Taking? Sig Documenting Provider Last Dose Status Informant  cefTRIAXone (ROCEPHIN) injection 500 mg [id number]   Brenda Fraction, MD  Active   FLUoxetine (PROZAC) 20 MG capsule [id number]  Take 1 capsule (20 mg total) by mouth daily.  Patient not taking: Reported on 10/18/2020   Brenda Cisco, NP  Active   hydrOXYzine (ATARAX/VISTARIL) 10 MG tablet [id number]  Take 1 tablet (10 mg total) by mouth 3 (three) times daily as needed.  Patient not taking: Reported  on 10/18/2020   Brenda Cisco, NP  Active   Multiple Vitamins-Minerals (MULTIVITAMIN WITH MINERALS) tablet [id number] No Take 1 tablet by mouth daily.  Patient not taking: No sig reported   [provider] Not Taking Active   norethindrone (ORTHO MICRONOR) 0.35 MG tablet [id number] No Take 1 tablet (0.35 mg total) by mouth daily.  Patient not taking: No sig reported   Brenda Cleveland, DO Not Taking Active   valACYclovir (VALTREX) 500 MG tablet [id number] No TAKE 1 TABLET BY MOUTH TWICE DAILY FOR 5 DAYS WHEN SYMPTOMS BEGIN  Patient not taking: No sig reported   Brenda Cleveland, DO Not Taking Active             Patient Active Problem List   Diagnosis Date Noted   Need for immunization against influenza 08/16/2020   Encounter for hepatitis C screening test for low risk patient 08/16/2020   Routine screening for STI (sexually transmitted infection) 08/16/2020   Encounter for Papanicolaou smear for cervical cancer screening 08/15/2020   Screening for diabetes mellitus 07/19/2020   Well adult on routine health check 07/19/2020   No-show for appointment 07/19/2020   Moderate episode of recurrent major depressive disorder (HCC) 12/22/2019   Panic disorder 11/24/2019   Anxiety disorder 03/25/2019   Keloid 10/14/2017   Encounter for other general counseling or advice on contraception 09/15/2014   Health care maintenance 05/13/2014   Sickle cell trait (HCC) 03/02/2014   Obesity 03/01/2014   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    Conditions to  be addressed/monitored per PCP order:  Anxiety and Depression  Care Plan : General Social Work (Adult)  Updates made by [name], LCSW since 02/21/2021 12:00 AM     Problem: Anxiety Identification (Anxiety)      Long-Range Goal: I want to find a new long term counselor   Start Date: 01/24/2021  Note:   Timeframe:  Long-Range Goal Priority:  High Start Date:    01/24/21                       Expected  End Date:  ongoing                     Follow Up Date 03/21/21  Current barriers:   Chronic Mental Health needs related to depression and anxiety Limited social support and Mental Health Concerns  Needs Support, Education, and Care Coordination in order to meet unmet mental health needs. Clinical Goal(s): demonstrate a reduction in symptoms related to :Anxiety  and Depression  explore community resource options for unmet needs related to:No needs identified during this visit  patient will work with SW to address concerns related to finding a long term counselor   Clinical Interventions:  Patient is discontinued mental health counseling at [facility] because she was not able to see her counselor but every few months. Patient is interested in seeing a counselor weekly or bi-weekly. Referral made for [facility] with patient's permission on 01/24/21.  Update- [name] has been unable to reach patient and patient is agreeable to contact their customer service number today on 02/07/21 to inquire. Update- Patient missed her counseling appointment on 02/13/21 and was encouraged to contact both [facility] and [facility]. Patient agreeable to call both contact numbers today on 02/21/21. Inter-disciplinary care team collaboration (see longitudinal plan of care) Assessed patient's previous and current treatment, coping skills, support system and barriers to care  Review various resources, discussed options and provided patient information about Options for mental health treatment based on need and insurance Depression screen reviewed , PHQ2/ PHQ9 completed, Solution-Focused Strategies, Mindfulness or Relaxation Training, Active listening / Reflection utilized , Emotional Supportive Provided, Behavioral Activation, Provided brief CBT , Participation in counseling encouraged , Suicidal Ideation/Homicidal Ideation assessed:, Discussed Health Care Power of Attorney , Discussed referral to [facility] to assist with connecting  to mental health provider, and Made referral to [facility] on 01/24/21  ; Patient Goals/Self-Care Activities: Over the next 120 days - self-awareness of emotional triggers encouraged - strategies to manage emotional triggers promoted - suicide risk screen reviewed - avoid negative self-talk - develop a personal safety plan - develop a plan to deal with triggers like holidays, anniversaries - exercise at least 2 to 3 times per week - have a plan for how to handle bad days - journal feelings and what helps to feel better or worse - spend time or talk with others at least 2 to 3 times per week - spend time or talk with others every day - watch for early signs of feeling worse - begin personal counseling - call and visit an old friend - check out volunteer opportunities - join a support group - laugh; watch a funny movie or comedian - learn and use visualization or guided imagery - perform a random act of kindness - practice relaxation or meditation daily - start or continue a personal journal - talk about feelings with a friend, family or spiritual advisor - practice positive thinking and self-talk I  have placed a referral with [facility] to assist with connecting you with a mental health provider. they will contact you once a provider is located.  Depression screen [facility] 2/9 01/24/2021 12/02/2020 10/18/2020 08/16/2020 10/30/2019  Decreased Interest 1 0 0 0 1  Down, Depressed, Hopeless 1 0 0 0 2  PHQ - 2 Score 2 0 0 0 3  Altered sleeping 0 - - 0 -  Tired, decreased energy 1 - - 0 -  Change in appetite 0 - - 0 -  Feeling bad or failure about yourself  0 - - 0 -  Trouble concentrating 0 - - 0 -  Moving slowly or fidgety/restless 0 - - 0 -  Suicidal thoughts 0 - - 0 -  PHQ-9 Score 3 - - 0 -  Difficult doing work/chores Somewhat difficult - - Not difficult at all -       Follow up:  Patient agrees to Care Plan and Follow-up.  Plan: The Managed Medicaid care management team will reach out to  the patient again over the next 45 days.  Date of next scheduled Social Work care management/care coordination outreach:  03/21/21  Dickie La, BSW, MSW, LCSW Managed Medicaid LCSW [facility]  [facility] [email or web address] Phone: [phone]

## 2021-02-21 NOTE — Patient Instructions (Signed)
Visit Information  Ms. Rutt was given information about Medicaid Managed Care team care coordination services as a part of their Healthy Blue Medicaid benefit. Arman Filter Dini verbally consented to engagement with the [facility] Managed Care team.   If you are experiencing a medical emergency, please call 911 or report to your local emergency department or urgent care.   If you have a non-emergency medical problem during routine business hours, please contact your provider's office and ask to speak with a nurse.   For questions related to your Healthy [facility] health plan, please call: [phone] or visit the homepage here: [email or web address]  If you would like to schedule transportation through your Healthy [facility] plan, please call the following number at least 2 days in advance of your appointment: [phone]  Call the [facility] Crisis Line at [phone], at any time, 24 hours a day, 7 days a week. If you are in danger or need immediate medical attention call 911.  If you would like help to quit smoking, call 1-800-QUIT-NOW (5731979814) OR Espaol: 1-855-Djelo-Ya (0-211-155-2080) o para ms informacin haga clic aqu or Text READY to 223-361 to register via text  Ms. Minturn - following are the goals we discussed in your visit today:   Goals Addressed             This Visit's Progress    Manage My Emotions       Timeframe:  Long-Range Goal Priority:  High Start Date:    01/24/21                       Expected End Date:  ongoing                     Follow Up Date 03/21/21   - begin personal counseling - call and visit an old friend - check out volunteer opportunities - join a support group - laugh; watch a funny movie or comedian - learn and use visualization or guided imagery - perform a random act of kindness - practice relaxation or meditation daily - start or continue a personal journal - talk about feelings  with a friend, family or spiritual advisor - practice positive thinking and self-talk    Why is this important?   When you are stressed, down or upset, your body reacts too.  For example, your blood pressure may get higher; you may have a headache or stomachache.  When your emotions get the best of you, your body's ability to fight off cold and flu gets weak.  These steps will help you manage your emotions.     Notes:         Dickie La, BSW, MSW, [employer] Managed Medicaid LCSW [facility]  [facility] [email or web address] Phone: [phone]

## 2021-02-22 DIAGNOSIS — F418 Other specified anxiety disorders: Secondary | ICD-10-CM | POA: Diagnosis not present

## 2021-03-02 DIAGNOSIS — N760 Acute vaginitis: Secondary | ICD-10-CM | POA: Diagnosis not present

## 2021-03-02 DIAGNOSIS — R109 Unspecified abdominal pain: Secondary | ICD-10-CM | POA: Diagnosis not present

## 2021-03-02 DIAGNOSIS — Z3202 Encounter for pregnancy test, result negative: Secondary | ICD-10-CM | POA: Diagnosis not present

## 2021-03-08 ENCOUNTER — Ambulatory Visit: Payer: Medicaid Other | Admitting: Family Medicine

## 2021-03-08 DIAGNOSIS — F418 Other specified anxiety disorders: Secondary | ICD-10-CM | POA: Diagnosis not present

## 2021-03-21 ENCOUNTER — Other Ambulatory Visit: Payer: Self-pay | Admitting: Licensed Clinical Social Worker

## 2021-03-21 NOTE — Patient Outreach (Signed)
[facility]) Care Management  03/21/2021  Brenda Kelley 09/15/1980 [id number]   LCSW completed [facility] outreach attempt today but was unable to reach patient successfully. A HIPPA compliant voice message was left encouraging patient to return call once available. LCSW will ask Scheduling Care Guide to reschedule [facility] SW appointment with patient as well.  Dickie La, BSW, MSW, [employer] Managed Medicaid LCSW [facility]  [facility] [email or web address] Phone: [phone]

## 2021-03-21 NOTE — Patient Instructions (Addendum)
   Visit Information  Ms. Grape was given information about Medicaid Managed Care team care coordination services as a part of their Healthy Blue Medicaid benefit. Arman Filter Rorabaugh verbally consented to engagement with the [facility] Managed Care team.   If you are experiencing a medical emergency, please call 911 or report to your local emergency department or urgent care.   If you have a non-emergency medical problem during routine business hours, please contact your provider's office and ask to speak with a nurse.   For questions related to your Healthy [facility] health plan, please call: [phone] or visit the homepage here: [email or web address]  If you would like to schedule transportation through your Healthy [facility] plan, please call the following number at least 2 days in advance of your appointment: [phone]  Call the [facility] Crisis Line at [phone], at any time, 24 hours a day, 7 days a week. If you are in danger or need immediate medical attention call 911.  If you would like help to quit smoking, call 1-800-QUIT-NOW (870-469-8953) OR Espaol: 1-855-Djelo-Ya (2-992-426-8341) o para ms informacin haga clic aqu or Text READY to 962-229 to register via text  Ms. Klepper - following are the goals we discussed in your visit today:   Goals Addressed             This Visit's Progress    Manage My Emotions       Timeframe:  Long-Range Goal Priority:  High Start Date:    01/24/21                       Expected End Date:  ongoing                      - begin personal counseling - call and visit an old friend - check out volunteer opportunities - join a support group - laugh; watch a funny movie or comedian - learn and use visualization or guided imagery - perform a random act of kindness - practice relaxation or meditation daily - start or continue a personal journal - talk about feelings with a friend, family  or spiritual advisor - practice positive thinking and self-talk    Why is this important?   When you are stressed, down or upset, your body reacts too.  For example, your blood pressure may get higher; you may have a headache or stomachache.  When your emotions get the best of you, your body's ability to fight off cold and flu gets weak.  These steps will help you manage your emotions.     Notes:        Dickie La, BSW, MSW, [employer] Managed Medicaid LCSW [facility]  [facility] [email or web address] Phone: [phone]

## 2021-03-21 NOTE — Patient Outreach (Addendum)
Medicaid Managed Care Social Work Note  03/21/2021 Name:  COREENA Kelley MRN:  [id number] DOB:  1981/03/21  Brenda Kelley is an 40 y.o. year old female who is a primary patient of [name], DO.  The Medicaid Managed Care Coordination team was consulted for assistance with:  Mental Health Counseling and Resources  Ms. Colee was given information about Medicaid Managed Care Coordination team services today. [name] Patient agreed to services and verbal consent obtained.  Engaged with patient  for by telephone for follow up visit in response to referral for case management and/or care coordination services.   Assessments/Interventions:  Review of past medical history, allergies, medications, health status, including review of consultants reports, laboratory and other test data, was performed as part of comprehensive evaluation and provision of chronic care management services.  SDOH: (Social Determinant of Health) assessments and interventions performed: SDOH Interventions    Flowsheet Row Most Recent Value  SDOH Interventions   Stress Interventions Provide Counseling       Advanced Directives Status:  See Care Plan for related entries.  Care Plan                 Allergies  Allergen Reactions   Cefaclor Rash    Medications Reviewed Today     Reviewed by Sandie Ano, RN (Registered Nurse) on 12/02/20 at [phone]  Med List Status: <None>   Medication Order Taking? Sig Documenting Provider Last Dose Status Informant  cefTRIAXone (ROCEPHIN) injection 500 mg [id number]   [name], MD  Active   FLUoxetine (PROZAC) 20 MG capsule [id number]  Take 1 capsule (20 mg total) by mouth daily.  Patient not taking: Reported on 10/18/2020   [name], NP  Active   hydrOXYzine (ATARAX/VISTARIL) 10 MG tablet [id number]  Take 1 tablet (10 mg total) by mouth 3 (three) times daily as needed.  Patient not taking: Reported on 10/18/2020   [name], NP   Active   Multiple Vitamins-Minerals (MULTIVITAMIN WITH MINERALS) tablet [id number] No Take 1 tablet by mouth daily.  Patient not taking: No sig reported   [provider] Not Taking Active   norethindrone (ORTHO MICRONOR) 0.35 MG tablet [id number] No Take 1 tablet (0.35 mg total) by mouth daily.  Patient not taking: No sig reported   [name], DO Not Taking Active   valACYclovir (VALTREX) 500 MG tablet [id number] No TAKE 1 TABLET BY MOUTH TWICE DAILY FOR 5 DAYS WHEN SYMPTOMS BEGIN  Patient not taking: No sig reported   [name], DO Not Taking Active             Patient Active Problem List   Diagnosis Date Noted   Need for immunization against influenza 08/16/2020   Encounter for hepatitis C screening test for low risk patient 08/16/2020   Routine screening for STI (sexually transmitted infection) 08/16/2020   Encounter for Papanicolaou smear for cervical cancer screening 08/15/2020   Screening for diabetes mellitus 07/19/2020   Well adult on routine health check 07/19/2020   No-show for appointment 07/19/2020   Moderate episode of recurrent major depressive disorder (HCC) 12/22/2019   Panic disorder 11/24/2019   Anxiety disorder 03/25/2019   Keloid 10/14/2017   Encounter for other general counseling or advice on contraception 09/15/2014   Health care maintenance 05/13/2014   Sickle cell trait (HCC) 03/02/2014   Obesity 03/01/2014   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    Conditions to be addressed/monitored per PCP  order:  Anxiety and Depression  Care Plan : General Social Work (Adult)  Updates made by [name], LCSW since 03/21/2021 12:00 AM     Problem: Anxiety Identification (Anxiety)      Long-Range Goal: I want to find a new long term counselor   Start Date: 01/24/2021  Note:   Timeframe:  Long-Range Goal Priority:  High Start Date:    01/24/21                       Expected End Date:  ongoing                      Current barriers:   Chronic Mental Health needs related to depression and anxiety Limited social support and Mental Health Concerns  Needs Support, Education, and Care Coordination in order to meet unmet mental health needs. Clinical Goal(s): demonstrate a reduction in symptoms related to :Anxiety  and Depression  explore community resource options for unmet needs related to:No needs identified during this visit  patient will work with SW to address concerns related to finding a long term counselor   Clinical Interventions:  Patient discontinued mental health counseling at [facility] because she was not able to see her counselor but every few months. Patient is interested in seeing a counselor weekly or bi-weekly. Referral made for [facility] with patient's permission on 01/24/21.  Update- [name] has been unable to reach patient and patient is agreeable to contact their customer service number today on 02/07/21 to inquire. Update- Patient missed her counseling appointment on 02/13/21 and was encouraged to contact both [facility] and [facility]. Patient agreeable to call both contact numbers today on 02/21/21. Update- 03/21/21- Patient has now started services at [facility]. Patient denies any other current case management needs or social work concerns.  Inter-disciplinary care team collaboration (see longitudinal plan of care) Assessed patient's previous and current treatment, coping skills, support system and barriers to care  Review various resources, discussed options and provided patient information about Options for mental health treatment based on need and insurance Depression screen reviewed , PHQ2/ PHQ9 completed, Solution-Focused Strategies, Mindfulness or Relaxation Training, Active listening / Reflection utilized , Emotional Supportive Provided, Behavioral Activation, Provided brief CBT , Participation in counseling encouraged , Suicidal Ideation/Homicidal Ideation  assessed:, Discussed Health Care Power of Attorney , Discussed referral to [facility] to assist with connecting to mental health provider, and Made referral to [facility] on 01/24/21  ; Update 03/21/21- Patient reports that she no longer to have follow up calls from [facility] team but will contact us directly if case management needs arise. She reports that she is now actively receiving mental health treatment at [facility]. She reports that she is getting counseling there and will eventually schedule her medication management appointment at office as well.  Patient Goals/Self-Care Activities: Over the next 120 days - self-awareness of emotional triggers encouraged - strategies to manage emotional triggers promoted - suicide risk screen reviewed - avoid negative self-talk - develop a personal safety plan - develop a plan to deal with triggers like holidays, anniversaries - exercise at least 2 to 3 times per week - have a plan for how to handle bad days - journal feelings and what helps to feel better or worse - spend time or talk with others at least 2 to 3 times per week - spend time or talk with others every day - watch for early  signs of feeling worse - begin personal counseling - call and visit an old friend - check out volunteer opportunities - join a support group - laugh; watch a funny movie or comedian - learn and use visualization or guided imagery - perform a random act of kindness - practice relaxation or meditation daily - start or continue a personal journal - talk about feelings with a friend, family or spiritual advisor - practice positive thinking and self-talk I have placed a previous referral with [facility] to assist with connecting you with a mental health provider. they will contact you once a provider is located. Update 03/21/21- Patient no longer wishes to pursue with [facility] as she has found another mental health provider.   Patient goal: Follow up  Depression  screen [facility] 2/9 01/24/2021 12/02/2020 10/18/2020 08/16/2020 10/30/2019  Decreased Interest 1 0 0 0 1  Down, Depressed, Hopeless 1 0 0 0 2  PHQ - 2 Score 2 0 0 0 3  Altered sleeping 0 - - 0 -  Tired, decreased energy 1 - - 0 -  Change in appetite 0 - - 0 -  Feeling bad or failure about yourself  0 - - 0 -  Trouble concentrating 0 - - 0 -  Moving slowly or fidgety/restless 0 - - 0 -  Suicidal thoughts 0 - - 0 -  PHQ-9 Score 3 - - 0 -  Difficult doing work/chores Somewhat difficult - - Not difficult at all -       Follow up:  Patient requests no follow-up at this time.  Plan: The Managed Medicaid care management team is available to follow up with the patient after provider conversation with the patient regarding recommendation for care management engagement and subsequent re-referral to the care management team.  Patient will contact [facility] team directly if any concerns arise in the future.  Dickie La, BSW, MSW, [employer] Managed Medicaid LCSW [facility]  [facility] [email or web address] Phone: [phone]

## 2021-04-13 DIAGNOSIS — L910 Hypertrophic scar: Secondary | ICD-10-CM | POA: Diagnosis not present

## 2021-05-05 DIAGNOSIS — F418 Other specified anxiety disorders: Secondary | ICD-10-CM | POA: Diagnosis not present

## 2021-06-07 DIAGNOSIS — L910 Hypertrophic scar: Secondary | ICD-10-CM | POA: Diagnosis not present

## 2021-06-08 DIAGNOSIS — L910 Hypertrophic scar: Secondary | ICD-10-CM | POA: Diagnosis not present

## 2021-06-21 DIAGNOSIS — L910 Hypertrophic scar: Secondary | ICD-10-CM | POA: Diagnosis not present

## 2021-06-27 ENCOUNTER — Ambulatory Visit: Payer: Medicaid Other | Admitting: Family Medicine

## 2021-06-29 NOTE — Progress Notes (Signed)
° ° ° °  SUBJECTIVE:   CHIEF COMPLAINT / HPI:   Brenda Kelley is a 41 y.o. female presents for STD testing  Pt desires STD testing today. Sexually active with one new female partner. No longer sexually active with husband given he has HIV. Contraception: condoms. Not trying to get pregnant. LMP: Jan 20th. Regular cycles.    [place] Office Visit from 06/30/2021 in [place]  PHQ-9 Total Score 1         PERTINENT  PMH / PSH: keloid, HSV-2  OBJECTIVE:   BP 122/74    Pulse 82    Ht $R'5\' 9"'My$  (1.753 m)    Wt 266 lb 9.6 oz (120.9 kg)    SpO2 100%    BMI 39.37 kg/m    General: Alert, no acute distress Cardio: well perfused  Pulm: normal work of breathing Neuro: Cranial nerves grossly intact   ASSESSMENT/PLAN:   Routine screening for STI (sexually transmitted infection) Pt chose urine cytology today for GC/chlamydia and trich. Also did HIV, Hep C and RPR labs. Safe sex counseling provided.   Birth control counseling Pt does not wish to get pregnant. Only using condoms atm. Explained that they are only 87% and she is still of a fertile age. Discussed birth control options such as tubal ligation given that she has completed child bearing, also IUD and nexplanon. She will follow up with PCP when she is decided on what she would like to do.    Lattie Haw, MD PGY-3 [place]

## 2021-06-30 ENCOUNTER — Other Ambulatory Visit (HOSPITAL_COMMUNITY)
Admission: RE | Admit: 2021-06-30 | Discharge: 2021-06-30 | Disposition: A | Discharge status: Home / Self Care | Payer: 59 | Source: Ambulatory Visit | Attending: Family Medicine | Admitting: Family Medicine

## 2021-06-30 ENCOUNTER — Ambulatory Visit: Payer: Medicaid Other | Admitting: Family Medicine

## 2021-06-30 ENCOUNTER — Other Ambulatory Visit: Payer: Self-pay

## 2021-06-30 ENCOUNTER — Encounter: Payer: Self-pay | Admitting: Family Medicine

## 2021-06-30 VITALS — BP 122/74 | HR 82 | Ht 69.0 in | Wt 266.6 lb

## 2021-06-30 DIAGNOSIS — Z3009 Encounter for other general counseling and advice on contraception: Secondary | ICD-10-CM

## 2021-06-30 DIAGNOSIS — Z113 Encounter for screening for infections with a predominantly sexual mode of transmission: Secondary | ICD-10-CM

## 2021-06-30 HISTORY — DX: Encounter for other general counseling and advice on contraception: Z30.09

## 2021-06-30 NOTE — Patient Instructions (Signed)
Thank you for coming to see me today. It was a pleasure.   We will get some labs today.  If they are abnormal or we need to do something about them, I will call you.  If they are normal, I will send you a message on MyChart (if it is active) or a letter in the mail.  If you don't hear from [place] in 2 weeks, please call the office at the number below.  Follow up with PCP for birth control.  If you have any questions or concerns, please do not hesitate to call the office at [phone].  Best wishes,   Dr Allena Katz

## 2021-06-30 NOTE — Assessment & Plan Note (Signed)
>>  ASSESSMENT AND PLAN FOR ROUTINE SCREENING FOR STI (SEXUALLY TRANSMITTED INFECTION) WRITTEN ON 06/30/2021 11:29 AM BY PATEL, POONAM, MD  Pt chose urine cytology today for GC/chlamydia and trich. Also did HIV, Hep C and RPR labs. Safe sex counseling provided.

## 2021-06-30 NOTE — Assessment & Plan Note (Signed)
Pt does not wish to get pregnant. Only using condoms atm. Explained that they are only 87% and she is still of a fertile age. Discussed birth control options such as tubal ligation given that she has completed child bearing, also IUD and nexplanon. She will follow up with PCP when she is decided on what she would like to do.

## 2021-06-30 NOTE — Assessment & Plan Note (Signed)
Pt chose urine cytology today for GC/chlamydia and trich. Also did HIV, Hep C and RPR labs. Safe sex counseling provided.

## 2021-07-01 LAB — HEPATITIS C ANTIBODY: Hep C Virus Ab: <0.1 s/co ratio (ref 0.0–0.9)

## 2021-07-01 LAB — RPR: RPR Ser Ql: NONREACTIVE

## 2021-07-01 LAB — HIV ANTIBODY (ROUTINE TESTING W REFLEX): HIV Screen 4th Generation wRfx: NONREACTIVE

## 2021-07-03 ENCOUNTER — Ambulatory Visit: Payer: Medicaid Other | Admitting: Family Medicine

## 2021-07-03 LAB — URINE CYTOLOGY ANCILLARY ONLY
Chlamydia: NEGATIVE
Comment: NEGATIVE
Comment: NEGATIVE
Comment: NORMAL
Neisseria Gonorrhea: NEGATIVE
Trichomonas: NEGATIVE

## 2021-07-26 DIAGNOSIS — F4320 Adjustment disorder, unspecified: Secondary | ICD-10-CM | POA: Diagnosis not present

## 2021-08-29 DIAGNOSIS — L910 Hypertrophic scar: Secondary | ICD-10-CM | POA: Diagnosis not present

## 2021-08-30 DIAGNOSIS — Z510 Encounter for antineoplastic radiation therapy: Secondary | ICD-10-CM | POA: Diagnosis not present

## 2021-08-30 DIAGNOSIS — L910 Hypertrophic scar: Secondary | ICD-10-CM | POA: Diagnosis not present

## 2021-09-26 ENCOUNTER — Encounter: Payer: Medicaid Other | Admitting: Family Medicine

## 2021-11-07 ENCOUNTER — Encounter: Payer: Self-pay | Admitting: *Deleted

## 2022-02-09 DIAGNOSIS — F418 Other specified anxiety disorders: Secondary | ICD-10-CM | POA: Diagnosis not present

## 2022-04-14 ENCOUNTER — Encounter: Payer: Medicaid Other | Admitting: Nurse Practitioner

## 2022-04-14 ENCOUNTER — Telehealth: Payer: Medicaid Other | Admitting: Nurse Practitioner

## 2022-04-14 DIAGNOSIS — J309 Allergic rhinitis, unspecified: Secondary | ICD-10-CM | POA: Diagnosis not present

## 2022-04-14 MED ORDER — FLUTICASONE PROPIONATE 50 MCG/ACT NA SUSP: 2.0000 | Freq: Every day | NASAL | 6 refills | Status: DC

## 2022-04-14 NOTE — Progress Notes (Signed)
E visit for Allergic Rhinitis We are sorry that you are not feeling well.  Here is how we plan to help!  Based on what you have shared with me it looks like you have Allergic Rhinitis.  Rhinitis is when a reaction occurs that causes nasal congestion, runny nose, sneezing, and itching.  Most types of rhinitis are caused by an inflammation and are associated with symptoms in the eyes ears or throat. There are several types of rhinitis.  The most common are acute rhinitis, which is usually caused by a viral illness, allergic or seasonal rhinitis, and nonallergic or year-round rhinitis.  Nasal allergies occur certain times of the year.  Allergic rhinitis is caused when allergens in the air trigger the release of histamine in the body.  Histamine causes itching, swelling, and fluid to build up in the fragile linings of the nasal passages, sinuses and eyelids.  An itchy nose and clear discharge are common.  I recommend the following over the counter treatments:  Flonase 2 sprays into each nostril once daily    HOME CARE:  You can use an over-the-counter saline nasal spray as needed Avoid areas where there is heavy dust, mites, or molds Stay indoors on windy days during the pollen season Keep windows closed in home, at least in bedroom; use air conditioner. Use high-efficiency house air filter Keep windows closed in car, turn AC on re-circulate Avoid playing out with dog during pollen season  GET HELP RIGHT AWAY IF:  If your symptoms do not improve within 10 days You become short of breath You develop yellow or green discharge from your nose for over 3 days You have coughing fits  MAKE SURE YOU:  Understand these instructions Will watch your condition Will get help right away if you are not doing well or get worse  Thank you for choosing an e-visit. Your e-visit answers were reviewed by a board certified advanced clinical practitioner to complete your personal care plan. Depending upon  the condition, your plan could have included both over the counter or prescription medications. Please review your pharmacy choice. Be sure that the pharmacy you have chosen is open so that you can pick up your prescription now.  If there is a problem you may message your provider in MyChart to have the prescription routed to another pharmacy. Your safety is important to [place]. If you have drug allergies check your prescription carefully.  For the next 24 hours, you can use MyChart to ask questions about today's visit, request a non-urgent call back, or ask for a work or school excuse from your e-visit provider. You will get an email in the next two days asking about your experience. I hope that your e-visit has been valuable and will speed your recovery.

## 2022-04-14 NOTE — Progress Notes (Signed)
I have spent 5 minutes in review of e-visit questionnaire, review and updating patient chart, medical decision making and response to patient.  ° °Brenda Kelley W Enyla Lisbon, NP ° °  °

## 2022-04-14 NOTE — Progress Notes (Signed)
Brenda Kelley had an evisit earlier for rhinorrhea and had additional questions regarding the flonase spray she was sent. All questions answered to patient's satisfaction today.

## 2022-05-15 ENCOUNTER — Encounter: Payer: Self-pay | Admitting: Student

## 2022-05-15 ENCOUNTER — Ambulatory Visit (INDEPENDENT_AMBULATORY_CARE_PROVIDER_SITE_OTHER): Payer: Medicaid Other | Admitting: Student

## 2022-05-15 VITALS — BP 123/93 | HR 69 | Ht 69.0 in | Wt 263.4 lb

## 2022-05-15 DIAGNOSIS — Z6838 Body mass index (BMI) 38.0-38.9, adult: Secondary | ICD-10-CM | POA: Diagnosis not present

## 2022-05-15 DIAGNOSIS — Z0000 Encounter for general adult medical examination without abnormal findings: Secondary | ICD-10-CM | POA: Diagnosis not present

## 2022-05-15 NOTE — Progress Notes (Signed)
    SUBJECTIVE:   Chief compliant/HPI: annual examination  Brenda Kelley is a 41 y.o. who presents today for an annual exam.   Patient reports that she is concerned about the possibility of having prediabetes as she had an elevated A1c on a recent annual performed by her work.   Review of systems form notable for none.   Updated history tabs and problem list.   OBJECTIVE:   BP (!) 123/93   Pulse 69   Ht 5\' 9"  (1.753 m)   Wt 263 lb 6.4 oz (119.5 kg)   LMP 05/05/2022   SpO2 100%   BMI 38.90 kg/m   General: Alert and oriented in no apparent distress Heart: Regular rate and rhythm with no murmurs appreciated Lungs: CTA bilaterally, no wheezing Abdomen: Bowel sounds present, no abdominal pain Skin: Warm and dry Extremities: No lower extremity edema   ASSESSMENT/PLAN:   No problem-specific Assessment & Plan notes found for this encounter.    Annual Examination  See AVS for age appropriate recommendations.   PHQ score     05/15/2022    9:17 AM 06/30/2021   10:16 AM 01/24/2021   10:53 AM  PHQ9 SCORE ONLY  PHQ-9 Total Score 0 1 3   Blood pressure reviewed and still elevated at recheck, will have her come in for nursing visit in 1 week for manual BP (unable to perform manual BP during visit as I was unable to find working cuff.) Asked about intimate partner violence and patient reports none-currently single with children at home.  The patient currently uses nothing for contraception.  She reports that she has not been sexually active for a year.  Her husband was diagnosed with HIV over a year ago and reported to her that he had been with other women, patient has been tested multiple times since that has been negative.  She was treated appropriately for gonorrhea that she had contracted from her husband but otherwise has been completely negative for RPR, HIV, hep C since his dx and has not been sexually active since.  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   Considered the following items based upon USPSTF recommendations: HIV testing:  previously performed and negative>not currently sexually active  Hepatitis C:  previously performed and negative Hepatitis B: Negative 9 yr ago  Syphilis if at high risk:  previously negative GC/CT>previously negative  Lipid panel (nonfasting or fasting) discussed based upon AHA recommendations and ordered.  Consider repeat every 4-6 years.  Reviewed risk factors for latent tuberculosis and not indicated Cervical cancer screening:  not indicated, UTD Slight hypernatremia on last BMP obtained 3 years ago, will obtain updated to check electrolytes and kidney function.   Patient is working on her nutrition and exercise, has lost approximately 4 pounds since she started watching her diet.  BMI of 38.9, referral placed for Dr. 01/26/2021 and provided her with Dr. Gerilyn Pilgrim information for assistance with nutrition.   Gerilyn Pilgrim, MD [facility] [place]

## 2022-05-15 NOTE — Patient Instructions (Addendum)
It was great to see you today! Thank you for choosing [facility] for your primary care. Brenda Kelley was seen for follow up.  Today we addressed: We will get some lab work  I will refer you to the nutritionist for dietary changes   If you haven't already, sign up for My Chart to have easy access to your labs results, and communication with your primary care physician.  We are checking some labs today. If they are abnormal, I will call you. If they are normal, I will send you a MyChart message (if it is active) or a letter in the mail. If you do not hear about your labs in the next 2 weeks, please call the office. I recommend that you always bring your medications to each appointment as this makes it easy to ensure you are on the correct medications and helps [place] not miss refills when you need them. Call the clinic at [phone] if your symptoms worsen or you have any concerns.  You should return to our clinic Return in about 3 months (around 08/14/2022). Please arrive 15 minutes before your appointment to ensure smooth check in process.  We appreciate your efforts in making this happen.  Thank you for allowing me to participate in your care, Alfredo Martinez, MD 05/15/2022, 10:22 AM PGY-2, [facility]

## 2022-05-16 LAB — LIPID PANEL
Chol/HDL Ratio: 3.7 ratio (ref 0.0–4.4)
Cholesterol, Total: 178 mg/dL (ref 100–199)
HDL: 48 mg/dL (ref >39)
LDL Chol Calc (NIH): 116 mg/dL — ABNORMAL HIGH (ref 0–99)
Triglycerides: 77 mg/dL (ref 0–149)
VLDL Cholesterol Cal: 14 mg/dL (ref 5–40)

## 2022-05-16 LAB — BASIC METABOLIC PANEL
BUN/Creatinine Ratio: 10 (ref 9–23)
BUN: 10 mg/dL (ref 6–20)
CO2: 23 mmol/L (ref 20–29)
Calcium: 9.6 mg/dL (ref 8.7–10.2)
Chloride: 105 mmol/L (ref 96–106)
Creatinine, Ser: 1 mg/dL (ref 0.57–1.00)
Glucose: 90 mg/dL (ref 70–99)
Potassium: 4.5 mmol/L (ref 3.5–5.2)
Sodium: 142 mmol/L (ref 134–144)
eGFR: 73 mL/min/{1.73_m2} (ref >59)

## 2022-05-16 LAB — HEMOGLOBIN A1C
Est. average glucose Bld gHb Est-mCnc: 120 mg/dL
Hgb A1c MFr Bld: 5.8 % — ABNORMAL HIGH (ref 4.8–5.6)

## 2022-05-18 ENCOUNTER — Ambulatory Visit: Payer: Medicaid Other | Admitting: Dietician

## 2022-05-18 ENCOUNTER — Other Ambulatory Visit: Payer: Self-pay | Admitting: Student

## 2022-05-18 ENCOUNTER — Encounter: Payer: Self-pay | Admitting: Student

## 2022-05-18 DIAGNOSIS — E785 Hyperlipidemia, unspecified: Secondary | ICD-10-CM

## 2022-05-18 MED ORDER — ROSUVASTATIN CALCIUM 20 MG PO TABS: 20.0000 mg | ORAL_TABLET | Freq: Every day | ORAL | 1 refills | Status: DC

## 2022-05-31 ENCOUNTER — Telehealth: Payer: Self-pay

## 2022-05-31 NOTE — Telephone Encounter (Signed)
Patient calls nurse line regarding rx refills. She is requesting that we send her prescriptions to [facility]. She states that they will need new prescriptions for 90 day supply.   If appropriate, please send 90 day supply of rosuvastatin and valacyclovir to [facility].   Veronda Prude, RN

## 2022-06-01 ENCOUNTER — Other Ambulatory Visit: Payer: Self-pay | Admitting: Student

## 2022-06-01 DIAGNOSIS — E785 Hyperlipidemia, unspecified: Secondary | ICD-10-CM

## 2022-06-01 DIAGNOSIS — B009 Herpesviral infection, unspecified: Secondary | ICD-10-CM

## 2022-06-01 MED ORDER — ROSUVASTATIN CALCIUM 20 MG PO TABS: 20.0000 mg | ORAL_TABLET | Freq: Every day | ORAL | 2 refills | Status: DC

## 2022-06-01 MED ORDER — VALACYCLOVIR HCL 500 MG PO TABS: ORAL_TABLET | ORAL | 2 refills | Status: DC

## 2022-06-01 NOTE — Progress Notes (Signed)
Sent refills through [facility] as instructed

## 2022-06-14 ENCOUNTER — Encounter: Payer: Self-pay | Admitting: Student

## 2022-06-14 ENCOUNTER — Telehealth: Payer: Medicaid Other | Admitting: Physician Assistant

## 2022-06-14 DIAGNOSIS — R102 Pelvic and perineal pain: Secondary | ICD-10-CM

## 2022-06-14 NOTE — Progress Notes (Signed)
Because of pelvic/vaginal discomfort and belly pain without discharge or other vaginal symptoms, this does not seem indicative of a typical vaginal infection, and I feel your condition warrants further evaluation and I recommend that you be seen in a face to face visit.   NOTE: There will be NO CHARGE for this eVisit   If you are having a true medical emergency please call 911.      For an urgent face to face visit, [place] has seven urgent care centers for your convenience:     [facility] [place] [facility] [place] Get Driving Directions [phone] [place], [place] [address]    [place] [facility]) Get Driving Directions [phone] [place], [place] [address]  [facility] [place] [facility]) Get Driving Directions [phone] [address] [place],  [place]  [address]  [place] [facility] - at [address] Get Driving Directions  [phone] [place],  [place] [address]   [facility] Get Driving Directions [phone] [place], [place], [place] [address]   [facility] Get Driving Directions  [phone] [address] [place], [place] [address]   [facility] [place] Get Driving Directions [phone] [address] [place], [place] [address]  Your MyChart E-visit questionnaire answers were reviewed by a board certified advanced clinical practitioner to complete your personal care plan based on your specific symptoms.  Thank you for using e-Visits.

## 2022-06-15 ENCOUNTER — Encounter: Payer: Self-pay | Admitting: Student

## 2022-06-15 ENCOUNTER — Ambulatory Visit: Payer: Medicaid Other | Admitting: Student

## 2022-06-15 ENCOUNTER — Other Ambulatory Visit (HOSPITAL_COMMUNITY)
Admission: RE | Admit: 2022-06-15 | Discharge: 2022-06-15 | Disposition: A | Discharge status: Home / Self Care | Payer: Medicaid Other | Source: Ambulatory Visit | Attending: Family Medicine | Admitting: Family Medicine

## 2022-06-15 VITALS — BP 121/95 | HR 71 | Ht 69.0 in | Wt 255.4 lb

## 2022-06-15 DIAGNOSIS — N760 Acute vaginitis: Secondary | ICD-10-CM | POA: Diagnosis present

## 2022-06-15 DIAGNOSIS — B9689 Other specified bacterial agents as the cause of diseases classified elsewhere: Secondary | ICD-10-CM

## 2022-06-15 DIAGNOSIS — R109 Unspecified abdominal pain: Secondary | ICD-10-CM | POA: Diagnosis not present

## 2022-06-15 DIAGNOSIS — R102 Pelvic and perineal pain: Secondary | ICD-10-CM | POA: Insufficient documentation

## 2022-06-15 LAB — POCT WET PREP (WET MOUNT)
Clue Cells Wet Prep Whiff POC: POSITIVE
Trichomonas Wet Prep HPF POC: ABSENT
WBC, Wet Prep HPF POC: NONE SEEN

## 2022-06-15 MED ORDER — METRONIDAZOLE 500 MG PO TABS: 500.0000 mg | ORAL_TABLET | Freq: Two times a day (BID) | ORAL | 0 refills | Status: AC

## 2022-06-15 NOTE — Patient Instructions (Addendum)
It was great to see you today! Thank you for choosing [facility] for your primary care. Brenda Kelley was seen for follow up.  Today we addressed: I will call with the rest of your results  Continue with Metronidazole twice a day for 7 days with food and no alcohol with antibiotics  If you haven't already, sign up for My Chart to have easy access to your labs results, and communication with your primary care physician.  We are checking some labs today. If they are abnormal, I will call you. If they are normal, I will send you a MyChart message (if it is active) or a letter in the mail. If you do not hear about your labs in the next 2 weeks, please call the office. I recommend that you always bring your medications to each appointment as this makes it easy to ensure you are on the correct medications and helps [place] not miss refills when you need them. Call the clinic at [phone] if your symptoms worsen or you have any concerns.  You should return to our clinic Return if symptoms worsen or fail to improve. Please arrive 15 minutes before your appointment to ensure smooth check in process.  We appreciate your efforts in making this happen.  Thank you for allowing me to participate in your care, Erskine Emery, MD 06/15/2022, 10:54 AM PGY-2, [place]

## 2022-06-15 NOTE — Assessment & Plan Note (Signed)
Reviewed labs and allergies, will check GC/Chlamydia and Trichomonas and wet prep given symptoms and was found to have BV on wet prep, treating with metronidazole. Discussed safe sex practices. Patient declined preg test, HIV, RPR as she is very low risk and uses protection with sexual intercourse. Patient will call if she would like further testing. Patient's questions answered to their satisfaction.

## 2022-06-15 NOTE — Progress Notes (Signed)
  SUBJECTIVE:   CHIEF COMPLAINT / HPI:   Abdominal Cramps  Abdominal Discomfort: Patient reports of many days abdominal discomfort and cramping in lower abdomen. No nausea, vomiting, diarrhea. Patient was sexually active last month, but used condoms. Patient's last menstrual period was 06/01/2022.  No abnormal discharge but patient reports that she has had BV in the past and feels that the symptoms are the same as previous. She has been using more [employer] lotions and notes that she has been taking more frequent baths.   PERTINENT  PMH / PSH: Sickle Cell Trait, prediabetes   OBJECTIVE:  BP (!) 121/95   Pulse 71   Ht 5\' 9"  (1.753 m)   Wt 255 lb 6.4 oz (115.8 kg)   LMP 06/01/2022   SpO2 100%   BMI 37.72 kg/m  Physical Exam Vitals reviewed.  Constitutional:      Appearance: Normal appearance.  HENT:     Head: Normocephalic.     Nose: Nose normal.     Mouth/Throat:     Mouth: Mucous membranes are moist.  Eyes:     Conjunctiva/sclera: Conjunctivae normal.  Cardiovascular:     Rate and Rhythm: Normal rate and regular rhythm.  Pulmonary:     Effort: Pulmonary effort is normal.     Breath sounds: Normal breath sounds.  Genitourinary:    General: Normal vulva.     Comments: Nml cervical, vaginal exam with scant discharge  Musculoskeletal:     Cervical back: Normal range of motion.  Skin:    Capillary Refill: Capillary refill takes less than 2 seconds.  Neurological:     General: No focal deficit present.     Mental Status: She is alert.  Psychiatric:        Mood and Affect: Mood normal.        Behavior: Behavior normal.      ASSESSMENT/PLAN:  Bacterial vaginosis -     metroNIDAZOLE; Take 1 tablet (500 mg total) by mouth 2 (two) times daily for 7 days.  Dispense: 14 tablet; Refill: 0  Abdominal cramps Assessment & Plan: Reviewed labs and allergies, will check GC/Chlamydia and Trichomonas and wet prep given symptoms and was found to have BV on wet prep,  treating with metronidazole. Discussed safe sex practices. Patient declined preg test, HIV, RPR as she is very low risk and uses protection with sexual intercourse. Patient will call if she would like further testing. Patient's questions answered to their satisfaction.    Orders: -     Cervicovaginal ancillary only -     POCT Wet Prep ([place])   Recheck BP at next visit.   Return if symptoms worsen or fail to improve. Erskine Emery, MD 06/15/2022, 12:02 PM PGY-2, [place]

## 2022-06-18 LAB — CERVICOVAGINAL ANCILLARY ONLY
Chlamydia: NEGATIVE
Comment: NEGATIVE
Comment: NEGATIVE
Comment: NORMAL
Neisseria Gonorrhea: NEGATIVE
Trichomonas: NEGATIVE

## 2022-06-19 ENCOUNTER — Encounter: Payer: Self-pay | Admitting: Student

## 2022-06-20 ENCOUNTER — Ambulatory Visit: Payer: Medicaid Other | Admitting: Registered"

## 2022-07-24 ENCOUNTER — Encounter: Payer: Self-pay | Admitting: Student

## 2022-07-25 MED ORDER — FLUCONAZOLE 150 MG PO TABS: 150.0000 mg | ORAL_TABLET | Freq: Once | ORAL | 0 refills | Status: AC

## 2022-08-08 ENCOUNTER — Encounter: Payer: Self-pay | Admitting: Student

## 2022-08-08 DIAGNOSIS — E785 Hyperlipidemia, unspecified: Secondary | ICD-10-CM

## 2022-08-10 MED ORDER — ROSUVASTATIN CALCIUM 20 MG PO TABS: 20.0000 mg | ORAL_TABLET | Freq: Every day | ORAL | 2 refills | Status: DC

## 2022-08-22 ENCOUNTER — Encounter: Payer: Self-pay | Admitting: Student

## 2022-08-22 MED ORDER — ROSUVASTATIN CALCIUM 20 MG PO TABS: 20.0000 mg | ORAL_TABLET | Freq: Every day | ORAL | 2 refills | Status: DC

## 2022-08-22 NOTE — Telephone Encounter (Signed)
[place]. They report when they try to run prescription, it states that provider is not enrolled in state plan.   They are requesting that attending resend prescription.   Rx pended and sent to Dr. Wendy Poet. Precepting provider this AM.   Talbot Grumbling, RN

## 2022-08-23 ENCOUNTER — Other Ambulatory Visit: Payer: Self-pay | Admitting: Student

## 2022-08-23 ENCOUNTER — Encounter: Payer: Self-pay | Admitting: Student

## 2022-08-23 DIAGNOSIS — N760 Acute vaginitis: Secondary | ICD-10-CM

## 2022-08-23 MED ORDER — CLINDAMYCIN PHOSPHATE 2 % VA CREA: 1.0000 | TOPICAL_CREAM | Freq: Every day | VAGINAL | 0 refills | Status: DC

## 2022-08-24 ENCOUNTER — Other Ambulatory Visit: Payer: Self-pay | Admitting: Student

## 2022-08-24 ENCOUNTER — Telehealth: Payer: Self-pay

## 2022-08-24 ENCOUNTER — Ambulatory Visit (INDEPENDENT_AMBULATORY_CARE_PROVIDER_SITE_OTHER): Payer: Medicaid Other

## 2022-08-24 DIAGNOSIS — Z131 Encounter for screening for diabetes mellitus: Secondary | ICD-10-CM

## 2022-08-24 DIAGNOSIS — B9689 Other specified bacterial agents as the cause of diseases classified elsewhere: Secondary | ICD-10-CM

## 2022-08-24 MED ORDER — CLINDESSE 2 % VA CREA: 1.0000 | TOPICAL_CREAM | Freq: Every evening | VAGINAL | 0 refills | Status: DC

## 2022-08-24 NOTE — Telephone Encounter (Signed)
Received mychart message from patietn that clindamycin vaginal cream is not covered by insurance.   Please see below preferred alternatives.    If appropriate, please send new rx to patient's pharmacy.   Talbot Grumbling, RN

## 2022-08-24 NOTE — Progress Notes (Signed)
Patient presents to nurse clinic for BP check.   BP today is 122/82, HR: 67, SpO2: 99% RA.   Last BP on 06/15/22 was 121/95.  Patient asking about A1c and cholesterol labs. Spoke with Dr. Zigmund Daniel who placed orders for lab work.   Talbot Grumbling, RN

## 2022-08-25 LAB — LIPID PANEL
Chol/HDL Ratio: 3.8 ratio (ref 0.0–4.4)
Cholesterol, Total: 159 mg/dL (ref 100–199)
HDL: 42 mg/dL (ref >39)
LDL Chol Calc (NIH): 105 mg/dL — ABNORMAL HIGH (ref 0–99)
Triglycerides: 62 mg/dL (ref 0–149)
VLDL Cholesterol Cal: 12 mg/dL (ref 5–40)

## 2022-08-25 LAB — HEMOGLOBIN A1C
Est. average glucose Bld gHb Est-mCnc: 123 mg/dL
Hgb A1c MFr Bld: 5.9 % — ABNORMAL HIGH (ref 4.8–5.6)

## 2022-08-30 ENCOUNTER — Telehealth: Payer: Self-pay

## 2022-08-30 NOTE — Telephone Encounter (Signed)
Patient LVM on nurse line regarding questions with Clindesse vaginal cream. She is asking how soon after taking application, when she can resume sexual activity.   She administrated applicator of medication yesterday morning.   Returned call to patient. Patient reports that she does not have vaginal irritation, odor or discharge.   Please advise.   Talbot Grumbling, RN

## 2022-08-31 ENCOUNTER — Other Ambulatory Visit (HOSPITAL_COMMUNITY): Payer: Self-pay

## 2022-09-03 ENCOUNTER — Telehealth: Payer: Self-pay | Admitting: Family Medicine

## 2022-09-03 DIAGNOSIS — Z789 Other specified health status: Secondary | ICD-10-CM

## 2022-09-03 MED ORDER — LEVONORGESTREL 1.5 MG PO TABS: 1.5000 mg | ORAL_TABLET | Freq: Once | ORAL | 0 refills | Status: AC

## 2022-09-03 NOTE — Telephone Encounter (Signed)
See phone note, plan B offered and sent to patient pharmacy. Also advised increase risk of STI.  Yehuda Savannah MD

## 2022-09-03 NOTE — Telephone Encounter (Signed)
Called patient. Patient had sexual intercourse on Friday and Saturday. She reports using condoms both times.   She denies current vaginal irritation.   Advised patient that I would let provider know and that we would reach out regarding next steps if needed.   Talbot Grumbling, RN

## 2022-09-03 NOTE — Telephone Encounter (Signed)
Called and spoke with Ms Bier. Discussed that due to recent use of Clindesse gel, it is recommended to not resume sexual intercourse or use barrier contraception including condoms for at least 5-7 days after vaginal gel use as the cream contains mineral oil that may weaken condoms and put people at increased risk for pregnancy and STIs. She notes vaginal intercourse with condoms on Friday and Saturday.   Offered plan B pill and confirmed and sent to patient pharmacy, recommended to take today as soon as possible.  Also discussed that weakened condoms can mean she is at increased risk for STIs.   Yehuda Savannah MD

## 2022-09-03 NOTE — Telephone Encounter (Signed)
Spoke with Dr. Thompson Grayer regarding concern.   She advised that she would call the patient to discuss matter further.   Talbot Grumbling, RN

## 2022-09-06 ENCOUNTER — Encounter: Payer: Self-pay | Admitting: Student

## 2022-09-06 NOTE — Telephone Encounter (Signed)
Patient returns call to nurse line. She LVM stating that pharmacy was just able to fill Plan B today. She is unsure if she should take medication at this point. She also reports clear vaginal discharge for the last 2-3 days. Today she noticed that discharge has changed in color to white.   She would like to discuss next steps with provider.   Please return call to patient at [phone].  Talbot Grumbling, RN

## 2022-09-14 ENCOUNTER — Encounter: Payer: Self-pay | Admitting: Student

## 2022-09-20 ENCOUNTER — Other Ambulatory Visit (HOSPITAL_COMMUNITY): Payer: Self-pay

## 2022-10-02 ENCOUNTER — Encounter: Payer: Self-pay | Admitting: Student

## 2022-10-02 ENCOUNTER — Other Ambulatory Visit: Payer: Self-pay

## 2022-10-02 DIAGNOSIS — B009 Herpesviral infection, unspecified: Secondary | ICD-10-CM

## 2022-10-03 MED ORDER — VALACYCLOVIR HCL 500 MG PO TABS
ORAL_TABLET | ORAL | 2 refills | Status: DC
Start: 2022-10-03 — End: 2022-12-25

## 2022-11-05 ENCOUNTER — Encounter: Payer: Self-pay | Admitting: Student

## 2022-11-07 NOTE — Telephone Encounter (Signed)
Scheduled patient to discuss FMLA paperwork on 11/13/22.  Veronda Prude, RN

## 2022-11-13 ENCOUNTER — Ambulatory Visit: Payer: Medicaid Other | Admitting: Student

## 2022-11-13 NOTE — Progress Notes (Deleted)
  SUBJECTIVE:   CHIEF COMPLAINT / HPI:   Discuss FMLA: -Reports of significant anxiety  -Previously requested FMLA for panic attacks 10/2019 -Reports of ***.  -*** therapy - *** medications    PERTINENT  PMH / PSH:   Past Medical History:  Diagnosis Date   Medical history non-contributory    Sickle cell trait (HCC)    Vaginal Pap smear, abnormal 2004    Patient Care Team: Alfredo Martinez, MD as PCP - General (Family Medicine) [name], LCSW as [facility] Care Management (Licensed Clinical Social Worker) OBJECTIVE:  There were no vitals taken for this visit. Physical Exam   ASSESSMENT/PLAN:  There are no diagnoses linked to this encounter. No follow-ups on file. Alfredo Martinez, MD 11/13/2022, 10:58 AM PGY-2, [facility] {    This will disappear when note is signed, click to select method of visit    :1}

## 2022-12-10 ENCOUNTER — Encounter: Payer: Self-pay | Admitting: Student

## 2022-12-20 ENCOUNTER — Encounter: Payer: Self-pay | Admitting: Student

## 2022-12-21 ENCOUNTER — Encounter: Payer: Self-pay | Admitting: Student

## 2022-12-24 ENCOUNTER — Ambulatory Visit (INDEPENDENT_AMBULATORY_CARE_PROVIDER_SITE_OTHER): Payer: Medicaid Other | Admitting: Student

## 2022-12-24 ENCOUNTER — Encounter: Payer: Self-pay | Admitting: Student

## 2022-12-24 VITALS — BP 113/80 | HR 68 | Ht 69.0 in | Wt 243.0 lb

## 2022-12-24 DIAGNOSIS — F411 Generalized anxiety disorder: Secondary | ICD-10-CM

## 2022-12-24 DIAGNOSIS — N898 Other specified noninflammatory disorders of vagina: Secondary | ICD-10-CM

## 2022-12-24 DIAGNOSIS — R102 Pelvic and perineal pain: Secondary | ICD-10-CM

## 2022-12-24 MED ORDER — PREMARIN 0.625 MG/GM VA CREA: 1.0000 | TOPICAL_CREAM | Freq: Every day | VAGINAL | 12 refills | Status: DC

## 2022-12-24 NOTE — Progress Notes (Unsigned)
  SUBJECTIVE:   CHIEF COMPLAINT / HPI:   Premenopausal Symptoms: Hot flashes at present, wakes up intermittently with sweating Vaginal dryness  Menstrual abnormalities--had three menstrual cycles in the last month but  Condom use frequently. No fever or chills, dyspnea or chest pain  Ongoing for about a full year.   Anxiety:  She has been having anxiety since 2023 and notes panic attacks with overwhelming stress, lasts to maximum 25 minutes in length. She is in the middle of a divorce.   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  Work has been very anxiety inducing and has been getting worse since around May 2024, calling out about 3-4 times.  She feels her anxiety is severe and worse since last visit. Patient has been on medication in the past.  Has seen [facility] as well.   Is requesting to fill out paperwork, approved from August 2024 for FMLA with accommodations for concern of frequent panic attacks.  Would not like to take any medication for this but amenable to therapy   -------------------------------------------------------------------------------------------------   PERTINENT  PMH / PSH: Elevated BMI, MDD, panic disorder, anxiety  Patient Care Team: Alfredo Martinez, MD as PCP - General (Family Medicine) [name], LCSW as [facility] Care Management (Licensed Clinical Social Worker) OBJECTIVE:  BP 113/80   Pulse 68   Ht 5\' 9"  (1.753 m)   Wt 243 lb (110.2 kg)   LMP 12/07/2022   SpO2 99%   BMI 35.88 kg/m  Physical Exam  General: Alert and oriented in no apparent distress Heart: Regular rate and rhythm with no murmurs appreciated Lungs: Normal WOB Skin: Warm and dry Psych: Normal affect and mood   ASSESSMENT/PLAN:  Vaginal dryness Assessment & Plan: Constellation of symptoms that make me suspicious for premenopause.  However, we will maintain a broad  differential as these may not have relation to each other.  Will have her return for premenopausal follow-up in 2 months.  In the meantime, continue with Premarin cream for vaginal dryness.  If the patient tolerates this well, could consider OCP to assist with estrogen and hopefully to improve symptoms.  Orders: -     Premarin; Place 1 Applicatorful vaginally daily. For 7 days and then space to twice a week until you see me  Dispense: 42.5 g; Refill: 12  Generalized anxiety disorder Assessment & Plan: Documented history of such, no SI or HI and declines medication.  Patient does report that she will attend therapy.  She will call her own therapist for discussion.  I believe it is reasonable for her to have accommodations with work, will fill out the appropriate paperwork and send to employer.  We are working towards improving patient's symptoms with therapy and I would like to see the patient approximately 1 week after she starts therapy to evaluate her symptoms.    Return in about 2 months (around 02/24/2023) for premenopausal follow up. Alfredo Martinez, MD 12/25/2022, 1:33 PM PGY-3, [facility]

## 2022-12-24 NOTE — Patient Instructions (Addendum)
It was great to see you today! Thank you for choosing [facility] for your primary care. Brenda Kelley was seen for follow up.  Today we addressed: Premarin cream x7 days and then twice a week for two months until you see me again   If you haven't already, sign up for My Chart to have easy access to your labs results, and communication with your primary care physician.  I recommend that you always bring your medications to each appointment as this makes it easy to ensure you are on the correct medications and helps [place] not miss refills when you need them. Call the clinic at [phone] if your symptoms worsen or you have any concerns.  You should return to our clinic Return in about 2 months (around 02/24/2023) for premenopausal follow up. Please arrive 15 minutes before your appointment to ensure smooth check in process.  We appreciate your efforts in making this happen.  Thank you for allowing me to participate in your care, Alfredo Martinez, MD 12/24/2022, 4:25 PM PGY-3, [facility]

## 2022-12-25 DIAGNOSIS — R102 Pelvic and perineal pain: Secondary | ICD-10-CM

## 2022-12-25 HISTORY — DX: Pelvic and perineal pain: R10.2

## 2022-12-25 NOTE — Assessment & Plan Note (Signed)
Documented history of such, no SI or HI and declines medication.  Patient does report that she will attend therapy.  She will call her own therapist for discussion.  I believe it is reasonable for her to have accommodations with work, will fill out the appropriate paperwork and send to employer.  We are working towards improving patient's symptoms with therapy and I would like to see the patient approximately 1 week after she starts therapy to evaluate her symptoms.

## 2022-12-25 NOTE — Assessment & Plan Note (Signed)
Constellation of symptoms that make me suspicious for premenopause.  However, we will maintain a broad differential as these may not have relation to each other.  Will have her return for premenopausal follow-up in 2 months.  In the meantime, continue with Premarin cream for vaginal dryness.  If the patient tolerates this well, could consider OCP to assist with estrogen and hopefully to improve symptoms.

## 2022-12-27 ENCOUNTER — Encounter: Payer: Self-pay | Admitting: Student

## 2023-01-16 ENCOUNTER — Encounter: Payer: Self-pay | Admitting: Student

## 2023-01-17 ENCOUNTER — Other Ambulatory Visit: Payer: Self-pay | Admitting: Student

## 2023-01-17 DIAGNOSIS — N760 Acute vaginitis: Secondary | ICD-10-CM

## 2023-01-17 MED ORDER — CLINDESSE 2 % VA CREA: 1.0000 | TOPICAL_CREAM | Freq: Every evening | VAGINAL | 0 refills | Status: DC

## 2023-01-17 NOTE — Progress Notes (Signed)
Clindesse for BV symptoms

## 2023-01-29 ENCOUNTER — Encounter: Payer: Self-pay | Admitting: Student

## 2023-01-30 ENCOUNTER — Ambulatory Visit (INDEPENDENT_AMBULATORY_CARE_PROVIDER_SITE_OTHER): Payer: Medicaid Other | Admitting: Student

## 2023-01-30 ENCOUNTER — Other Ambulatory Visit (HOSPITAL_COMMUNITY)
Admission: RE | Admit: 2023-01-30 | Discharge: 2023-01-30 | Disposition: A | Discharge status: Home / Self Care | Payer: Medicaid Other | Source: Ambulatory Visit | Attending: Family Medicine | Admitting: Family Medicine

## 2023-01-30 VITALS — BP 113/82 | HR 83 | Ht 69.0 in | Wt 237.2 lb

## 2023-01-30 DIAGNOSIS — Z113 Encounter for screening for infections with a predominantly sexual mode of transmission: Secondary | ICD-10-CM | POA: Insufficient documentation

## 2023-01-30 DIAGNOSIS — A64 Unspecified sexually transmitted disease: Secondary | ICD-10-CM | POA: Insufficient documentation

## 2023-01-30 LAB — POCT WET PREP (WET MOUNT)
Clue Cells Wet Prep Whiff POC: NEGATIVE
Trichomonas Wet Prep HPF POC: ABSENT

## 2023-01-30 NOTE — Progress Notes (Cosign Needed)
    SUBJECTIVE:   CHIEF COMPLAINT / HPI:    MANOLA BRUMFIELD is a 42 year old female here for STI screening.  She is sexually active with 1 partner, monogamous relationship for the past 10 months.  That is now ending. Uses condoms for contraception. She has oral and vaginal intercourse.  Denies any vaginal discharge, itching, odor, irritation. Does not have any major concern that she has an infection, but just wants to get baseline testing.  Also reporting some perimenopausal symptoms.  Has night sweats, hot flashes. Still having regular periods, they vary in 4 to 6 days in duration. Her mom completed menopause by t 38 years of age.  She has been exercising by walking a couple miles a day, and watching her diet.  And she has lost a good amount of weight which she is very proud of.  Per our documentation, she was to 143 pounds in 7/22, today she is 237 pounds.   PERTINENT  PMH / PSH: Reviewed  OBJECTIVE:   BP 113/82   Pulse 83   Ht 5\' 9"  (1.753 m)   Wt 237 lb 3.2 oz (107.6 kg)   LMP 01/21/2023   SpO2 100%   BMI 35.03 kg/m General: Pleasant and well-appearing Respiratory: Breathes normally on room air GU: Cleatrice Burke, CMA present for pelvic exam as chaperone.  External vulva and vagina nonerythematous, without any obvious lesions or rash. Yellowish tinted discharge appreciated.  Normal ruggae of vaginal walls.  Cervix is non erythematous and non-friable.  ASSESSMENT/PLAN:   Screening examination for STI Asymptomatic.  Normal physical exam. Desired STI screening-collected gonorrhea, chlamydia, trichomonas, BV and yeast, HIV and RPR. Encouraged to continue using condom and other barrier methods during intercourse. Will call patient if results are abnormal, will send MyChart message if normal.    Darral Dash, DO [facility]

## 2023-01-30 NOTE — Assessment & Plan Note (Addendum)
Asymptomatic.  Normal physical exam. Desired STI screening-collected gonorrhea, chlamydia, trichomonas, BV and yeast, HIV and RPR. Encouraged to continue using condom and other barrier methods during intercourse. Will call patient if results are abnormal, will send MyChart message if normal.

## 2023-01-30 NOTE — Patient Instructions (Addendum)
It was great seeing you today.  As we discussed, -We collected testing today.  I will call you if anything is abnormal. -If your hot flashes and other symptoms become bothersome, please let us know and we can talk about treatment options.  I encourage you to continue exercising and eating well-you have done a great job of taking care of yourself and should be proud.   If you have any questions or concerns, please feel free to call the clinic.   Have a wonderful day,  Dr. Darral Dash [facility] [phone]

## 2023-01-31 LAB — RPR: RPR Ser Ql: NONREACTIVE

## 2023-01-31 LAB — HIV ANTIBODY (ROUTINE TESTING W REFLEX): HIV Screen 4th Generation wRfx: NONREACTIVE

## 2023-02-07 LAB — CERVICOVAGINAL ANCILLARY ONLY
Chlamydia: NEGATIVE
Comment: NEGATIVE
Comment: NEGATIVE
Comment: NORMAL
Neisseria Gonorrhea: NEGATIVE
Trichomonas: NEGATIVE

## 2023-04-29 ENCOUNTER — Encounter: Payer: Self-pay | Admitting: Student

## 2023-05-23 NOTE — Telephone Encounter (Signed)
Patient calls nurse line regarding concern. She is requesting returned call from PCP to discuss requirements regarding paperwork further.   Please return call to patient at [phone].  Veronda Prude, RN

## 2023-07-09 ENCOUNTER — Encounter: Payer: Medicaid Other | Admitting: Student

## 2023-07-29 ENCOUNTER — Ambulatory Visit (INDEPENDENT_AMBULATORY_CARE_PROVIDER_SITE_OTHER): Payer: Medicaid Other | Admitting: Student

## 2023-07-29 ENCOUNTER — Encounter: Payer: Self-pay | Admitting: Student

## 2023-07-29 VITALS — BP 123/75 | HR 75 | Ht 69.0 in | Wt 245.4 lb

## 2023-07-29 DIAGNOSIS — Z0000 Encounter for general adult medical examination without abnormal findings: Secondary | ICD-10-CM | POA: Diagnosis present

## 2023-07-29 LAB — POCT GLYCOSYLATED HEMOGLOBIN (HGB A1C): Hemoglobin A1C: 5.5 % (ref 4.0–5.6)

## 2023-07-29 MED ORDER — BENZONATATE 100 MG PO CAPS: 100.0000 mg | ORAL_CAPSULE | Freq: Two times a day (BID) | ORAL | 0 refills | Status: DC | PRN

## 2023-07-29 NOTE — Patient Instructions (Addendum)
 It was great to see you today! Thank you for choosing [facility] for your primary care.  Today we addressed: We will check labs today  2. You need a mammogram to prevent breast cancer.  Please schedule an appointment.  You can call [phone].     3. If you become sexually active, let me know  4. Tessalon Pearls for cough, do not use around children  If you haven't already, sign up for My Chart to have easy access to your labs results, and communication with your primary care physician.  Please arrive 15 minutes before your appointment to ensure smooth check in process.  We appreciate your efforts in making this happen.  Thank you for allowing me to participate in your care, Alfredo Martinez, MD 07/29/2023, 1:44 PM PGY-3, [facility]

## 2023-07-29 NOTE — Progress Notes (Addendum)
    SUBJECTIVE:   Chief compliant/HPI: annual examination  Brenda Kelley is a 43 y.o. who presents today for an annual exam.   History tabs reviewed and updated.   Review of systems form reviewed and notable for cough continuing after Flu <2 weeks ago, no dyspnea or chest pain, no fever or chills.   OBJECTIVE:   BP 123/75   Pulse 75   Ht 5\' 9"  (1.753 m)   Wt 245 lb 6.4 oz (111.3 kg)   LMP 07/14/2023   SpO2 95%   BMI 36.24 kg/m   General: Alert and oriented in no apparent distress Heart: Regular rate and rhythm with no murmurs appreciated Lungs: CTA bilaterally, no wheezing Abdomen: Bowel sounds present, no abdominal pain Skin: Warm and dry   ASSESSMENT/PLAN:   Assessment & Plan Annual physical exam Annual Examination  See AVS for age appropriate recommendations.   PHQ score     01/30/2023    9:53 AM 12/24/2022    3:52 PM 06/15/2022   10:18 AM  PHQ9 SCORE ONLY  PHQ-9 Total Score 1 4 0  Reviewed and discussed.  Blood pressure reviewed and at goal.  Asked about intimate partner violence and resources given as appropriate  The patient currently not sexually active  Considered the following items based upon USPSTF recommendations: Diabetes screening: ordered 5.9 11 mo ago Screening for elevated cholesterol: LDL 105 11 mo ago  HIV testing: 6 mo NR  Hepatitis C: Hep C NR 2 year ago Hepatitis B: N/A Syphilis if at high risk: NR 6 mo ago  GC/CT - not sexually active  Reviewed risk factors for latent tuberculosis and not indicated Reviewed risk factors for osteoporosis. Using FRAX tool estimated risk of major osteoporotic fracture of  1.3%, early screening not ordered Cervical cancer screening: 08/2020 NILM HPV negative -- next pap 2027 Breast cancer screening: discussed potential benefits, risks including overdiagnosis and biopsy, elected proceed with mammogram. 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  Colorectal cancer  screening: not applicable given age.   Follow up in 1 year or sooner if indicated.    Cough after Flu: Post infectious, provided tessalon pearl, continue with symptomatic treatment, could last 4-6 weeks.   Alfredo Martinez, MD [facility]

## 2023-07-30 LAB — LIPID PANEL
Chol/HDL Ratio: 2.9 {ratio} (ref 0.0–4.4)
Cholesterol, Total: 164 mg/dL (ref 100–199)
HDL: 56 mg/dL (ref >39)
LDL Chol Calc (NIH): 95 mg/dL (ref 0–99)
Triglycerides: 64 mg/dL (ref 0–149)
VLDL Cholesterol Cal: 13 mg/dL (ref 5–40)

## 2023-07-30 LAB — COMPREHENSIVE METABOLIC PANEL
ALT: 7 [IU]/L (ref 0–32)
AST: 15 [IU]/L (ref 0–40)
Albumin: 4.1 g/dL (ref 3.9–4.9)
Alkaline Phosphatase: 51 [IU]/L (ref 44–121)
BUN/Creatinine Ratio: 7 — ABNORMAL LOW (ref 9–23)
BUN: 5 mg/dL — ABNORMAL LOW (ref 6–24)
Bilirubin Total: 0.5 mg/dL (ref 0.0–1.2)
CO2: 22 mmol/L (ref 20–29)
Calcium: 9.2 mg/dL (ref 8.7–10.2)
Chloride: 103 mmol/L (ref 96–106)
Creatinine, Ser: 0.72 mg/dL (ref 0.57–1.00)
Globulin, Total: 2.5 g/dL (ref 1.5–4.5)
Glucose: 82 mg/dL (ref 70–99)
Potassium: 4.3 mmol/L (ref 3.5–5.2)
Sodium: 141 mmol/L (ref 134–144)
Total Protein: 6.6 g/dL (ref 6.0–8.5)
eGFR: 108 mL/min/{1.73_m2} (ref >59)

## 2023-07-30 LAB — CBC WITH DIFFERENTIAL/PLATELET
Basophils Absolute: 0 10*3/uL (ref 0.0–0.2)
Basos: 1 %
EOS (ABSOLUTE): 0.2 10*3/uL (ref 0.0–0.4)
Eos: 7 %
Hematocrit: 30.5 % — ABNORMAL LOW (ref 34.0–46.6)
Hemoglobin: 8.8 g/dL — ABNORMAL LOW (ref 11.1–15.9)
Immature Grans (Abs): 0 10*3/uL (ref 0.0–0.1)
Immature Granulocytes: 0 %
Lymphocytes Absolute: 1.2 10*3/uL (ref 0.7–3.1)
Lymphs: 33 %
MCH: 18.3 pg — ABNORMAL LOW (ref 26.6–33.0)
MCHC: 28.9 g/dL — ABNORMAL LOW (ref 31.5–35.7)
MCV: 63 fL — ABNORMAL LOW (ref 79–97)
Monocytes Absolute: 0.5 10*3/uL (ref 0.1–0.9)
Monocytes: 13 %
Neutrophils Absolute: 1.8 10*3/uL (ref 1.4–7.0)
Neutrophils: 46 %
Platelets: 466 10*3/uL — ABNORMAL HIGH (ref 150–450)
RBC: 4.81 x10E6/uL (ref 3.77–5.28)
RDW: 19.8 % — ABNORMAL HIGH (ref 11.7–15.4)
WBC: 3.7 10*3/uL (ref 3.4–10.8)

## 2023-07-31 ENCOUNTER — Encounter: Payer: Self-pay | Admitting: Student

## 2023-08-04 MED ORDER — FERROUS SULFATE 325 (65 FE) MG PO TABS: 325.0000 mg | ORAL_TABLET | ORAL | 0 refills | Status: DC

## 2023-08-21 ENCOUNTER — Ambulatory Visit
Admission: RE | Admit: 2023-08-21 | Discharge: 2023-08-21 | Disposition: A | Discharge status: Home / Self Care | Payer: Medicaid Other | Source: Ambulatory Visit | Attending: Family Medicine | Admitting: Family Medicine

## 2023-08-21 DIAGNOSIS — Z0000 Encounter for general adult medical examination without abnormal findings: Secondary | ICD-10-CM

## 2023-08-23 ENCOUNTER — Other Ambulatory Visit: Payer: Self-pay | Admitting: Family Medicine

## 2023-08-23 DIAGNOSIS — R928 Other abnormal and inconclusive findings on diagnostic imaging of breast: Secondary | ICD-10-CM

## 2023-09-11 ENCOUNTER — Other Ambulatory Visit

## 2023-09-11 ENCOUNTER — Encounter

## 2023-09-11 ENCOUNTER — Ambulatory Visit
Admission: RE | Admit: 2023-09-11 | Discharge: 2023-09-11 | Disposition: A | Discharge status: Home / Self Care | Source: Ambulatory Visit | Attending: Family Medicine | Admitting: Family Medicine

## 2023-09-11 ENCOUNTER — Other Ambulatory Visit: Payer: Self-pay | Admitting: Family Medicine

## 2023-09-11 DIAGNOSIS — N6310 Unspecified lump in the right breast, unspecified quadrant: Secondary | ICD-10-CM

## 2023-09-11 DIAGNOSIS — R928 Other abnormal and inconclusive findings on diagnostic imaging of breast: Secondary | ICD-10-CM

## 2023-09-11 DIAGNOSIS — N6320 Unspecified lump in the left breast, unspecified quadrant: Secondary | ICD-10-CM

## 2023-10-21 ENCOUNTER — Encounter: Payer: Self-pay | Admitting: Student

## 2023-11-07 ENCOUNTER — Other Ambulatory Visit: Payer: Self-pay | Admitting: Student

## 2023-11-12 ENCOUNTER — Encounter: Payer: Self-pay | Admitting: *Deleted

## 2023-11-20 ENCOUNTER — Ambulatory Visit (HOSPITAL_COMMUNITY)
Admission: EM | Admit: 2023-11-20 | Discharge: 2023-11-20 | Disposition: A | Discharge status: Home / Self Care | Attending: Physician Assistant | Admitting: Physician Assistant

## 2023-11-20 ENCOUNTER — Encounter (HOSPITAL_COMMUNITY): Payer: Self-pay

## 2023-11-20 DIAGNOSIS — Z113 Encounter for screening for infections with a predominantly sexual mode of transmission: Secondary | ICD-10-CM

## 2023-11-20 LAB — HIV ANTIBODY (ROUTINE TESTING W REFLEX): HIV Screen 4th Generation wRfx: NONREACTIVE

## 2023-11-20 NOTE — Discharge Instructions (Addendum)
 Monitor your MyChart for your results.  We will contact you if anything is positive will need to arrange treatment.  I do recommend coming back in about 4 weeks for repeat testing.  If you develop any symptoms including pelvic pain, abdominal pain, vaginal discharge, fever, influenza-like illness you should return sooner for additional testing.

## 2023-11-20 NOTE — ED Provider Notes (Signed)
 [facility]    CSN: [id number] Arrival date & time: 11/20/23  1727      History   Chief Complaint Chief Complaint  Patient presents with   Exposure to STD    HPI Brenda Kelley is a 43 y.o. female.   Patient presents today for STI testing.  She denies any symptoms including pelvic pain, abdominal pain, fever, nausea, vomiting, vaginal discharge, influenza-like illness.  She is sexually active with female partners and reports that she had an encounter approximately 5 days ago where the condom broke.  She has no concern for pregnancy.  She denies any specific exposures.  She is requesting a complete STI panel.    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/22/2019   Sickle cell trait (HCC)    Vaginal pain 12/25/2022   Vaginal Pap smear, abnormal 06/04/2002    Patient Active Problem List   Diagnosis Date Noted   Screening examination for STI 01/30/2023   BMI 38.0-38.9,adult 05/15/2022   Need for immunization against influenza 08/16/2020   Encounter for Papanicolaou smear for cervical cancer screening 08/15/2020   Screening for diabetes mellitus 07/19/2020   Well adult on routine health check 07/19/2020   No-show for appointment 07/19/2020   Panic disorder 11/24/2019   Anxiety disorder 03/25/2019   Sickle cell trait (HCC) 03/02/2014   Obesity 03/01/2014   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 Authorizing Provider  conjugated estrogens  (PREMARIN ) vaginal cream Place 1 Applicatorful vaginally daily. For 7 days and then space to twice a week until you see me 12/24/22   Ernestina Headland, MD    Family History Family History  Problem Relation Age of Onset   Heart disease Mother    Hypertension Mother    Alcohol abuse Mother    Stroke Mother    Cancer Maternal Aunt 17       cancer   Breast cancer Maternal Aunt        2 Maternal Aunts breast cancer    Social History Social History   Tobacco Use   Smoking status: Never   Smokeless tobacco: Never  Substance Use Topics   Alcohol use: Yes    Comment: socially/wine  Drug use: No     Allergies   Patient has no known allergies.   Review of Systems Review of Systems  Constitutional:  Negative for activity change, appetite change, fatigue and fever.  Gastrointestinal:  Negative for abdominal pain, diarrhea, nausea and vomiting.  Genitourinary:  Negative for dysuria, frequency, pelvic pain, urgency, vaginal bleeding, vaginal discharge and vaginal pain.     Physical Exam Triage Vital Signs ED Triage Vitals [11/20/23 1827]  Encounter Vitals Group     BP 122/84     Girls Systolic BP Percentile      Girls Diastolic BP Percentile      Boys Systolic BP Percentile      Boys Diastolic BP Percentile      Pulse Rate 71     Resp 16     Temp 98.1 F (36.7 C)     Temp Source Oral     SpO2 98 %     Weight      Height      Head Circumference      Peak Flow      Pain Score 0     Pain Loc      Pain Education       Exclude from Growth Chart    No data found.  Updated Vital Signs BP 122/84 (BP Location: Left Arm)   Pulse 71   Temp 98.1 F (36.7 C) (Oral)   Resp 16   LMP 10/21/2023 (Approximate)   SpO2 98%   Visual Acuity Right Eye Distance:   Left Eye Distance:   Bilateral Distance:    Right Eye Near:   Left Eye Near:    Bilateral Near:     Physical Exam Vitals reviewed.  Constitutional:      General: She is awake. She is not in acute distress.    Appearance: Normal appearance. She is well-developed. She is not ill-appearing.     Comments: Very pleasant female appears stated age in no acute distress sitting comfortably in exam room  HENT:     Head: Normocephalic and atraumatic.   Cardiovascular:     Rate and Rhythm: Normal rate and regular rhythm.     Heart sounds: Normal heart sounds, S1 normal and S2 normal. No murmur heard. Pulmonary:     Effort: Pulmonary effort is normal.     Breath sounds: Normal breath sounds. No wheezing, rhonchi or rales.     Comments: Clear to auscultation bilaterally Abdominal:     General: Bowel sounds are normal.     Palpations: Abdomen is soft.     Tenderness: There is no abdominal tenderness. There is no right CVA tenderness, left CVA tenderness, guarding or rebound.     Comments: Benign abdominal exam  Genitourinary:    Comments: Exam deferred  Psychiatric:        Behavior: Behavior is cooperative.      UC Treatments / Results  Labs (all labs ordered are listed, but only abnormal results are displayed) Labs Reviewed  RPR  HIV ANTIBODY (ROUTINE TESTING W REFLEX)  CERVICOVAGINAL ANCILLARY ONLY    EKG   Radiology No results found.  Procedures Procedures (including critical care time)  Medications Ordered in UC Medications - No data to display  Initial Impression / Assessment and Plan / UC Course  I have reviewed the triage vital signs and the nursing notes.  Pertinent labs & imaging results that were available during  my care of the patient were reviewed by me and considered in  my medical decision making (see chart for details).     Patient is well-appearing, afebrile, nontoxic, nontachycardic.  Patient is currently asymptomatic so we will defer empiric treatment.  We did discuss that it is a little bit soon for her RPR and HIV to be positive if she was potentially exposed and so I recommended that she return at 4 weeks, 3 months, 6 months for repeat testing.  We discussed that if she develops any symptoms she should return for reevaluation.  All questions answered to patient satisfaction.  Final Clinical Impressions(s) / UC Diagnoses   Final diagnoses:  Screening examination for STI     Discharge Instructions      Monitor your MyChart for your results.  We will contact you if anything is positive will need to arrange treatment.  I do recommend coming back in about 4 weeks for repeat testing.  If you develop any symptoms including pelvic pain, abdominal pain, vaginal discharge, fever, influenza-like illness you should return sooner for additional testing.     ED Prescriptions   None    PDMP not reviewed this encounter.   Budd Cargo, PA-C 11/20/23 1842

## 2023-11-20 NOTE — Progress Notes (Deleted)
    SUBJECTIVE:   CHIEF COMPLAINT / HPI:   STI check - recently became sexually active with a new partner, wants to be checked for STIs. Previously tested for ***.  - preferred gender of partner: *** - Medications tried: *** - Sexually active with *** *** partner(s) - Last sexual encounter: *** - Contraception: *** Symptoms include: {STISXs:28021}   PERTINENT  PMH / PSH: ***  OBJECTIVE:   There were no vitals taken for this visit.  ***  ASSESSMENT/PLAN:   Assessment & Plan      Ernestina Headland, MD [facility]

## 2023-11-20 NOTE — ED Triage Notes (Signed)
 Pt states she would like to be tested for STD's.  Denies any symptoms. State the condom broke last time she had intercourse.

## 2023-11-21 LAB — RPR: RPR Ser Ql: NONREACTIVE

## 2023-11-21 LAB — CERVICOVAGINAL ANCILLARY ONLY
Chlamydia: NEGATIVE
Comment: NEGATIVE
Comment: NEGATIVE
Comment: NORMAL
Neisseria Gonorrhea: NEGATIVE
Trichomonas: NEGATIVE

## 2023-11-22 ENCOUNTER — Ambulatory Visit: Admitting: Student

## 2023-12-02 ENCOUNTER — Ambulatory Visit: Admitting: Student

## 2023-12-02 ENCOUNTER — Other Ambulatory Visit (HOSPITAL_COMMUNITY)
Admission: RE | Admit: 2023-12-02 | Discharge: 2023-12-02 | Disposition: A | Discharge status: Home / Self Care | Source: Ambulatory Visit | Attending: Family Medicine | Admitting: Family Medicine

## 2023-12-02 VITALS — BP 124/89 | HR 68 | Ht 69.0 in | Wt 251.0 lb

## 2023-12-02 DIAGNOSIS — Z113 Encounter for screening for infections with a predominantly sexual mode of transmission: Secondary | ICD-10-CM | POA: Diagnosis present

## 2023-12-02 NOTE — Patient Instructions (Signed)
  Estuvo muy bien verte! Gracias por permitirme participar en su cuidado!   Nuestros planes para hoy: - Utilice crema de permetrina al 5%. - Por favor acuda a su cita con dermatologa.   Tenga cuidado y busque atencin inmediata lo antes posible si tiene alguna inquietud. Recuerde presentarse 15 minutos antes de la hora programada para su cita!  Martin Bronson, DO [facility]  

## 2023-12-02 NOTE — Assessment & Plan Note (Signed)
 Asymptomatic.  Screening today. - Gonorrhea, chlamydia, RPR, HIV, trichomonas - Will follow-up results and treat accordingly

## 2023-12-02 NOTE — Progress Notes (Signed)
    SUBJECTIVE:   CHIEF COMPLAINT / HPI:   STI screening Patient reports new sexual partner.  She has had history of STI in the past, and would like to be cautious.  She has no symptoms today.  Patient is using barrier methods.  She is not on birth control, may be interested in picking at this in the future with her new PCP.  OBJECTIVE:   BP 124/89   Pulse 68   Ht 5' 9 (1.753 m)   Wt 251 lb (113.9 kg)   LMP 11/26/2023 (Approximate)   SpO2 99%   BMI 37.07 kg/m     Pelvic: VULVA: normal appearing vulva with no masses, tenderness or lesions, VAGINA: Normal appearing vagina with normal color, no lesions, with scant and white discharge present  CERVIX: No lesions, scant and white discharge present  Chaperone Alexis CMA present for pelvic exam  ASSESSMENT/PLAN:   Assessment & Plan Screening examination for STI Asymptomatic.  Screening today. - Gonorrhea, chlamydia, RPR, HIV, trichomonas - Will follow-up results and treat accordingly   Gladis Church, DO [facility]

## 2023-12-03 ENCOUNTER — Ambulatory Visit: Payer: Self-pay | Admitting: Student

## 2023-12-03 DIAGNOSIS — B9689 Other specified bacterial agents as the cause of diseases classified elsewhere: Secondary | ICD-10-CM

## 2023-12-05 LAB — CERVICOVAGINAL ANCILLARY ONLY
Bacterial Vaginitis (gardnerella): POSITIVE — AB
Candida Glabrata: NEGATIVE
Candida Vaginitis: NEGATIVE
Chlamydia: NEGATIVE
Comment: NEGATIVE
Comment: NEGATIVE
Comment: NEGATIVE
Comment: NEGATIVE
Comment: NEGATIVE
Comment: NORMAL
Neisseria Gonorrhea: NEGATIVE
Trichomonas: NEGATIVE

## 2023-12-10 LAB — HIV ANTIBODY (ROUTINE TESTING W REFLEX): HIV Screen 4th Generation wRfx: NONREACTIVE

## 2023-12-10 LAB — RPR: RPR Ser Ql: NONREACTIVE

## 2023-12-24 MED ORDER — METRONIDAZOLE 500 MG PO TABS: 500.0000 mg | ORAL_TABLET | Freq: Two times a day (BID) | ORAL | 0 refills | Status: DC

## 2023-12-26 ENCOUNTER — Encounter: Payer: Self-pay | Admitting: Student

## 2023-12-26 DIAGNOSIS — N760 Acute vaginitis: Secondary | ICD-10-CM

## 2023-12-27 MED ORDER — METRONIDAZOLE 500 MG PO TABS: 500.0000 mg | ORAL_TABLET | Freq: Two times a day (BID) | ORAL | 0 refills | Status: AC

## 2024-01-20 ENCOUNTER — Telehealth: Payer: Self-pay

## 2024-01-20 DIAGNOSIS — Z0000 Encounter for general adult medical examination without abnormal findings: Secondary | ICD-10-CM

## 2024-01-20 DIAGNOSIS — Z130 Encounter for screening for diseases of the blood and blood-forming organs and certain disorders involving the immune mechanism: Secondary | ICD-10-CM

## 2024-01-20 NOTE — Telephone Encounter (Signed)
 Patient called to schedule appt with new PCP for 9/18, asked if orders for lab work can  go ahead and be placed before her appt. She went ahead and schedule a lab appt for 9/4.

## 2024-02-06 ENCOUNTER — Encounter: Payer: Self-pay | Admitting: Student

## 2024-02-06 ENCOUNTER — Other Ambulatory Visit

## 2024-02-06 DIAGNOSIS — Z130 Encounter for screening for diseases of the blood and blood-forming organs and certain disorders involving the immune mechanism: Secondary | ICD-10-CM

## 2024-02-06 NOTE — Telephone Encounter (Signed)
 Patient calls nurse line in regards to lab work.   She reports she is unsure why she did not have complete lab work done.   She reports she is very upset about this. She reports she was under the impression she would be getting A1c, lipids etc..  Advised will forward to new PCP.   Advised A1c and Cholesterol were within normal range ~ 6 months ago.

## 2024-02-07 NOTE — Telephone Encounter (Signed)
Attempted to reach patient. No answer. LVM of note left by provider. Aquilla Solian, CMA

## 2024-02-08 LAB — FERRITIN: Ferritin: 26 ng/mL (ref 15–150)

## 2024-02-10 ENCOUNTER — Other Ambulatory Visit: Payer: Self-pay

## 2024-02-10 NOTE — Telephone Encounter (Signed)
 Fax from [facility] [employer] for VALACYCLOVIR  HYDROCHORIDE. I dont see this on current medication list. If you want patient to start this medication please send Rx to pharmacy with dosage, duration and instructions. Called patient to confirm that she indeed uses this pharmacy and she does. Nelson Land, CMA

## 2024-02-14 ENCOUNTER — Ambulatory Visit

## 2024-02-18 ENCOUNTER — Telehealth: Payer: Self-pay

## 2024-02-18 MED ORDER — VALACYCLOVIR HCL 500 MG PO TABS: ORAL_TABLET | ORAL | 0 refills | Status: DC

## 2024-02-18 NOTE — Telephone Encounter (Signed)
 Patient calls nurse line stating that she was just on the phone with Dr. Lennie and told her that she would need to call her back.   Advised that PCP is not in our clinic right now and I will send her a message requesting that she return call to patient.   Call back number for patient is [phone].  Chiquita JAYSON English, RN

## 2024-02-20 ENCOUNTER — Ambulatory Visit: Payer: Self-pay | Admitting: Family Medicine

## 2024-02-27 MED ORDER — VALACYCLOVIR HCL 500 MG PO TABS: ORAL_TABLET | ORAL | 0 refills | Status: AC

## 2024-03-12 ENCOUNTER — Ambulatory Visit
Admission: RE | Admit: 2024-03-12 | Discharge: 2024-03-12 | Disposition: A | Source: Ambulatory Visit | Attending: Family Medicine | Admitting: Family Medicine

## 2024-03-12 ENCOUNTER — Other Ambulatory Visit: Payer: Self-pay | Admitting: Family Medicine

## 2024-03-12 ENCOUNTER — Ambulatory Visit
Admission: RE | Admit: 2024-03-12 | Discharge: 2024-03-12 | Disposition: A | Discharge status: Home / Self Care | Source: Ambulatory Visit | Attending: Family Medicine | Admitting: Family Medicine

## 2024-03-12 DIAGNOSIS — N6320 Unspecified lump in the left breast, unspecified quadrant: Secondary | ICD-10-CM

## 2024-03-12 DIAGNOSIS — N6322 Unspecified lump in the left breast, upper inner quadrant: Secondary | ICD-10-CM

## 2024-03-12 DIAGNOSIS — N6310 Unspecified lump in the right breast, unspecified quadrant: Secondary | ICD-10-CM

## 2024-03-19 ENCOUNTER — Encounter: Payer: Self-pay | Admitting: Family Medicine

## 2024-03-19 DIAGNOSIS — N63 Unspecified lump in unspecified breast: Secondary | ICD-10-CM | POA: Insufficient documentation

## 2024-03-19 NOTE — Progress Notes (Signed)
 Pt had a scheduled 6 month follow up breast US  which showed: 'Significant increase in size of the mass in the UPPER INNER QUADRANT of the LEFT breast at 11 o'clock 4 cm from the nipple. Measurements are given above. 2. Stable RIGHT breast masses.  She has been scheduled for core biopsy tomorrow.  Pertinent hx of issue: mammogram 09/11/2023: - CLINICAL DATA:  43 year old female presents for further evaluation of bilateral breast masses identified on baseline screening mammogram. 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 IMPRESSION: 1. Circumscribed oval hypoechoic masses within both breast, 2 on the RIGHT side and 1 on the LEFT side. 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 Options of follow-up and tissue sampling were presented to the patient. At this time we both agreed to proceed with short-term follow-up  - follow up US  03/12/2024 IMPRESSION: 1. Significant increase in size of the mass in the UPPER INNER QUADRANT of the LEFT breast at 11 o'clock 4 cm from the nipple. 2. Stable RIGHT breast masses.  RECOMMENDATION: 1. Ultrasound-guided core needle biopsy of the enlarging LEFT breast mass. 2. Annual diagnostic mammography and RIGHT breast ultrasound in 6 months.

## 2024-03-20 ENCOUNTER — Ambulatory Visit
Admission: RE | Admit: 2024-03-20 | Discharge: 2024-03-20 | Disposition: A | Discharge status: Home / Self Care | Source: Ambulatory Visit | Attending: Family Medicine | Admitting: Family Medicine

## 2024-03-20 DIAGNOSIS — N6322 Unspecified lump in the left breast, upper inner quadrant: Secondary | ICD-10-CM

## 2024-03-20 HISTORY — PX: BREAST BIOPSY: SHX20

## 2024-03-23 LAB — SURGICAL PATHOLOGY

## 2024-04-15 ENCOUNTER — Encounter: Payer: Self-pay | Admitting: Student

## 2024-04-16 NOTE — Progress Notes (Unsigned)
    SUBJECTIVE:   CHIEF COMPLAINT / HPI:   Vaginal spotting x2 days, light pink  LMP 11/2-11/8 which is shorter than usual Some vaginal dryness Normal cycle in October No birth control No discharge, odor, cramping, pain, itching. No abd pain Not sexually active since September, used condom at that time Hx similar sx in the past with trichomonas No new meds in the last month. Has been under more stress than usual in the last month.   PERTINENT  PMH / PSH: sickle cell trait, anxiety  OBJECTIVE:   BP 114/88   Pulse 81   Ht 5' 9 (1.753 m)   Wt 263 lb 3.2 oz (119.4 kg)   LMP 04/05/2024   SpO2 100%   BMI 38.87 kg/m    General: well appearing, NAD Pelvic - Thersia Meissner, CMA present. Pt uncomfortable throughout speculum exam. VULVA: normal appearing vulva with no masses, tenderness or lesions  VAGINA: normal appearing vagina with normal color and discharge, no lesions  CERVIX: normal appearing cervix without discharge or lesions, no CMT. Dark red/purple blood in vaginal vault, did not visualize source  ADNEXA: No adnexal tenderness noted.  ASSESSMENT/PLAN:   Assessment & Plan Vaginal spotting Upreg negative. UA unremarkable for infection. Wet prep consistent with BV, given sx will treat with Metronidazole  500mg  BID x7 days STD testing pending Will check TSH, CBC, and iron levels (pt has hx low ferritin) due to intermenstrual bleeding Discussed risk/benefit of obtaining transvaginal ultrasound now versus waiting to see if symptoms improve.  The ultrasound would look for any uterine or ovarian abnormalities that could cause intermenstrual bleeding.  Patient opted to proceed with ultrasound now     Elyce Prescott, DO [facility]

## 2024-04-17 ENCOUNTER — Other Ambulatory Visit (HOSPITAL_COMMUNITY)
Admission: RE | Admit: 2024-04-17 | Discharge: 2024-04-17 | Disposition: A | Discharge status: Home / Self Care | Source: Ambulatory Visit | Attending: Family Medicine | Admitting: Family Medicine

## 2024-04-17 ENCOUNTER — Ambulatory Visit (INDEPENDENT_AMBULATORY_CARE_PROVIDER_SITE_OTHER): Admitting: Family Medicine

## 2024-04-17 ENCOUNTER — Encounter: Payer: Self-pay | Admitting: Family Medicine

## 2024-04-17 VITALS — BP 114/88 | HR 81 | Ht 69.0 in | Wt 263.2 lb

## 2024-04-17 DIAGNOSIS — N939 Abnormal uterine and vaginal bleeding, unspecified: Secondary | ICD-10-CM

## 2024-04-17 LAB — POCT URINE DIPSTICK
Bilirubin, UA: NEGATIVE
Glucose, UA: NEGATIVE mg/dL
Ketones, POC UA: NEGATIVE mg/dL
Leukocytes, UA: NEGATIVE
Nitrite, UA: NEGATIVE
POC PROTEIN,UA: NEGATIVE
Spec Grav, UA: 1.02 (ref 1.010–1.025)
Urobilinogen, UA: 0.2 U/dL
pH, UA: 7.5 (ref 5.0–8.0)

## 2024-04-17 LAB — POCT WET PREP (WET MOUNT)
Clue Cells Wet Prep Whiff POC: POSITIVE
Trichomonas Wet Prep HPF POC: ABSENT

## 2024-04-17 LAB — POCT URINE PREGNANCY: Preg Test, Ur: NEGATIVE

## 2024-04-17 MED ORDER — METRONIDAZOLE 500 MG PO TABS: 500.0000 mg | ORAL_TABLET | Freq: Two times a day (BID) | ORAL | 0 refills | Status: AC

## 2024-04-17 NOTE — Addendum Note (Signed)
 Addended by: Wakisha Alberts L on: 04/17/2024 11:26 AM   Modules accepted: Orders

## 2024-04-17 NOTE — Patient Instructions (Signed)
 Good to see you today - Thank you for coming in  Things we discussed today:  You have bacterial vaginosis. We are treating you with a medicine called Metronidazole .  Do not douche. Do not use deodorant sprays on your vagina. Wear cotton underwear. Do not wear tight pants and pantyhose, especially in the summer.  If you finish treatment and are still having bleeding between your periods, please let us  know.

## 2024-04-18 LAB — CBC
Hematocrit: 37.6 % (ref 34.0–46.6)
Hemoglobin: 11.1 g/dL (ref 11.1–15.9)
MCH: 21.9 pg — ABNORMAL LOW (ref 26.6–33.0)
MCHC: 29.5 g/dL — ABNORMAL LOW (ref 31.5–35.7)
MCV: 74 fL — ABNORMAL LOW (ref 79–97)
Platelets: 348 x10E3/uL (ref 150–450)
RBC: 5.07 x10E6/uL (ref 3.77–5.28)
RDW: 18 % — ABNORMAL HIGH (ref 11.7–15.4)
WBC: 4.1 x10E3/uL (ref 3.4–10.8)

## 2024-04-18 LAB — IRON AND TIBC
Iron Saturation: 14 % — ABNORMAL LOW (ref 15–55)
Iron: 53 ug/dL (ref 27–159)
Total Iron Binding Capacity: 382 ug/dL (ref 250–450)
UIBC: 329 ug/dL (ref 131–425)

## 2024-04-18 LAB — HIV ANTIBODY (ROUTINE TESTING W REFLEX): HIV Screen 4th Generation wRfx: NONREACTIVE

## 2024-04-18 LAB — RPR: RPR Ser Ql: NONREACTIVE

## 2024-04-18 LAB — TSH: TSH: 1.26 u[IU]/mL (ref 0.450–4.500)

## 2024-04-20 ENCOUNTER — Ambulatory Visit: Payer: Self-pay | Admitting: Family Medicine

## 2024-04-20 LAB — CERVICOVAGINAL ANCILLARY ONLY
Chlamydia: NEGATIVE
Comment: NEGATIVE
Comment: NEGATIVE
Comment: NORMAL
Neisseria Gonorrhea: NEGATIVE
Trichomonas: NEGATIVE

## 2024-04-20 MED ORDER — FERROUS SULFATE 324 MG PO TBEC: 324.0000 mg | DELAYED_RELEASE_TABLET | ORAL | 2 refills | Status: AC

## 2024-04-22 ENCOUNTER — Ambulatory Visit (HOSPITAL_BASED_OUTPATIENT_CLINIC_OR_DEPARTMENT_OTHER)

## 2024-04-24 ENCOUNTER — Encounter: Payer: Self-pay | Admitting: Student

## 2024-05-04 ENCOUNTER — Ambulatory Visit: Admitting: Family Medicine

## 2024-05-18 NOTE — Telephone Encounter (Signed)
See other mychart message thread

## 2024-05-22 ENCOUNTER — Ambulatory Visit (INDEPENDENT_AMBULATORY_CARE_PROVIDER_SITE_OTHER): Payer: Self-pay

## 2024-05-22 VITALS — BP 114/78 | HR 72 | Ht 69.0 in | Wt 261.8 lb

## 2024-05-22 DIAGNOSIS — N923 Ovulation bleeding: Secondary | ICD-10-CM | POA: Diagnosis not present

## 2024-05-22 DIAGNOSIS — F419 Anxiety disorder, unspecified: Secondary | ICD-10-CM

## 2024-05-22 DIAGNOSIS — F410 Panic disorder [episodic paroxysmal anxiety] without agoraphobia: Secondary | ICD-10-CM | POA: Diagnosis present

## 2024-05-22 NOTE — Progress Notes (Signed)
" ° ° °  SUBJECTIVE:   CHIEF COMPLAINT / HPI: Needs paperwork completed  Patient presents today requesting paperwork for accommodations at work.  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  She works from home and states her finger to be referred over today.  He has a history of panic attacks and 2-3 times a week.  She states these can last anywhere from 1 to 2 hours.  During this time, she will have to cough up work.  She was told to get accommodations paperwork filled out for job security.  During this episode she experienced minor symptoms with arms, heart racing.  She previously saw therapist 3 times weekly.  She has not been seen by her therapist for 1 year due to multiple missed appointments.  She states she needs to get scheduled manage she, very beneficial.  She is very hesitant to start medication due to watching Has been going through trials of multiple meds.  Patient also endorses concern today about possibly being perimenopausal.  She states she is still having periods regularly once a month.  However, she states she feels like they are heavier and she is passing more clots.  She also has been spotting for couple of days in between her periods.  She also endorses feeling hot at night.  Patient would like to have a Pap smear just to make sure everything is okay.   PERTINENT  PMH / PSH: Panic disorder, anxiety disorder  OBJECTIVE:   BP 114/78   Pulse 72   Ht 5' 9 (1.753 m)   Wt 261 lb 12.8 oz (118.8 kg)   SpO2 96%   BMI 38.66 kg/m   General: Well-appearing female, NAD Cardiovascular: RRR, no M/R/G Respiratory: CTAB, normal work of breathing on room air Psych: Mood and affect appropriate  ASSESSMENT/PLAN:   Assessment & Plan Panic disorder Anxiety disorder, unspecified type Patient with history of panic and anxiety disorder.  Having panic attacks 2-3 times a week.  Currently not on medications or enrolled in therapy. - Will complete  work accommodations due to patient's panic attacks - Encouraged patient to try and get back in with her therapist - Discussed benefit of medication plus therapy, patient not interested at this time will reevaluate Today Intermenstrual bleeding Patient having a couple days of spotting in between menstrual cycles. - Patient previously referred for pelvic ultrasound, advised she call back and reschedule the appointment - Patient requesting Pap smear, advise she make an additional appointment to complete this - Follow-up in 1 month     Raguel KANDICE Lee, DO [facility] "

## 2024-05-22 NOTE — Patient Instructions (Signed)
 I will fill out your paperwork and fax it back. Please set up another appointment and we can do a pap smear.   I would like to see you back in 1 month to check in on the bleeding and everything. We can do the pap at that time or sooner if you would like.

## 2024-05-25 NOTE — Assessment & Plan Note (Signed)
 Patient with history of panic and anxiety disorder.  Having panic attacks 2-3 times a week.  Currently not on medications or enrolled in therapy. - Will complete work accommodations due to patient's panic attacks - Encouraged patient to try and get back in with her therapist - Discussed benefit of medication plus therapy, patient not interested at this time will reevaluate Today

## 2024-06-03 ENCOUNTER — Ambulatory Visit (HOSPITAL_BASED_OUTPATIENT_CLINIC_OR_DEPARTMENT_OTHER)

## 2024-07-16 ENCOUNTER — Ambulatory Visit (HOSPITAL_COMMUNITY)
